# Patient Record
Sex: Female | Born: 1975 | Race: White | Hispanic: No | Marital: Single | Smoking: Never smoker
Health system: Southern US, Community
[De-identification: ages and names within clinical notes are randomized; demographics above are authoritative.]

## PROBLEM LIST (undated history)

## (undated) DIAGNOSIS — D649 Anemia, unspecified: Secondary | ICD-10-CM

## (undated) DIAGNOSIS — F419 Anxiety disorder, unspecified: Secondary | ICD-10-CM

## (undated) DIAGNOSIS — F32A Depression, unspecified: Secondary | ICD-10-CM

## (undated) DIAGNOSIS — Z789 Other specified health status: Secondary | ICD-10-CM

## (undated) DIAGNOSIS — R011 Cardiac murmur, unspecified: Secondary | ICD-10-CM

## (undated) HISTORY — PX: FRACTURE SURGERY: SHX138

## (undated) HISTORY — PX: APPENDECTOMY: SHX54

## (undated) HISTORY — DX: Anxiety disorder, unspecified: F41.9

## (undated) HISTORY — DX: Cardiac murmur, unspecified: R01.1

## (undated) HISTORY — DX: Depression, unspecified: F32.A

## (undated) HISTORY — DX: Anemia, unspecified: D64.9

---

## 2001-04-04 HISTORY — PX: GANGLION CYST EXCISION: SHX1691

## 2003-04-05 HISTORY — PX: WISDOM TOOTH EXTRACTION: SHX21

## 2008-04-04 HISTORY — PX: WRIST FRACTURE SURGERY: SHX121

## 2012-12-12 DIAGNOSIS — M7061 Trochanteric bursitis, right hip: Secondary | ICD-10-CM | POA: Insufficient documentation

## 2012-12-12 DIAGNOSIS — M25559 Pain in unspecified hip: Secondary | ICD-10-CM | POA: Insufficient documentation

## 2013-08-16 DIAGNOSIS — Z0000 Encounter for general adult medical examination without abnormal findings: Secondary | ICD-10-CM | POA: Insufficient documentation

## 2013-08-16 DIAGNOSIS — L709 Acne, unspecified: Secondary | ICD-10-CM | POA: Insufficient documentation

## 2013-08-16 DIAGNOSIS — F32A Depression, unspecified: Secondary | ICD-10-CM | POA: Insufficient documentation

## 2013-08-16 DIAGNOSIS — F419 Anxiety disorder, unspecified: Secondary | ICD-10-CM | POA: Insufficient documentation

## 2013-08-16 DIAGNOSIS — E663 Overweight: Secondary | ICD-10-CM | POA: Insufficient documentation

## 2016-06-13 ENCOUNTER — Ambulatory Visit: Payer: Self-pay | Admitting: Family Medicine

## 2018-04-02 DIAGNOSIS — K644 Residual hemorrhoidal skin tags: Secondary | ICD-10-CM | POA: Insufficient documentation

## 2018-04-04 DIAGNOSIS — D689 Coagulation defect, unspecified: Secondary | ICD-10-CM

## 2018-04-04 HISTORY — DX: Coagulation defect, unspecified: D68.9

## 2018-05-03 ENCOUNTER — Observation Stay (HOSPITAL_COMMUNITY): Payer: BLUE CROSS/BLUE SHIELD | Admitting: Anesthesiology

## 2018-05-03 ENCOUNTER — Observation Stay (HOSPITAL_COMMUNITY)
Admission: EM | Admit: 2018-05-03 | Discharge: 2018-05-04 | Disposition: A | Discharge status: Home / Self Care | Payer: BLUE CROSS/BLUE SHIELD | Attending: Surgery | Admitting: Surgery

## 2018-05-03 ENCOUNTER — Encounter (HOSPITAL_COMMUNITY)
Admission: EM | Disposition: A | Discharge status: Home / Self Care | Payer: Self-pay | Source: Home / Self Care | Attending: Emergency Medicine

## 2018-05-03 ENCOUNTER — Other Ambulatory Visit: Payer: Self-pay

## 2018-05-03 ENCOUNTER — Encounter (HOSPITAL_COMMUNITY): Payer: Self-pay | Admitting: Emergency Medicine

## 2018-05-03 ENCOUNTER — Emergency Department (HOSPITAL_COMMUNITY): Payer: BLUE CROSS/BLUE SHIELD

## 2018-05-03 DIAGNOSIS — Z975 Presence of (intrauterine) contraceptive device: Secondary | ICD-10-CM | POA: Diagnosis not present

## 2018-05-03 DIAGNOSIS — K3533 Acute appendicitis with perforation and localized peritonitis, with abscess: Secondary | ICD-10-CM | POA: Diagnosis not present

## 2018-05-03 DIAGNOSIS — R109 Unspecified abdominal pain: Secondary | ICD-10-CM | POA: Diagnosis present

## 2018-05-03 DIAGNOSIS — K3580 Unspecified acute appendicitis: Secondary | ICD-10-CM | POA: Diagnosis present

## 2018-05-03 HISTORY — DX: Other specified health status: Z78.9

## 2018-05-03 HISTORY — PX: LAPAROSCOPIC APPENDECTOMY: SHX408

## 2018-05-03 LAB — CBC
HCT: 42.1 % (ref 36.0–46.0)
Hemoglobin: 13.7 g/dL (ref 12.0–15.0)
MCH: 30 pg (ref 26.0–34.0)
MCHC: 32.5 g/dL (ref 30.0–36.0)
MCV: 92.1 fL (ref 80.0–100.0)
Platelets: 223 10*3/uL (ref 150–400)
RBC: 4.57 MIL/uL (ref 3.87–5.11)
RDW: 12.2 % (ref 11.5–15.5)
WBC: 19.1 10*3/uL — ABNORMAL HIGH (ref 4.0–10.5)
nRBC: 0 % (ref 0.0–0.2)

## 2018-05-03 LAB — COMPREHENSIVE METABOLIC PANEL
ALT: 17 U/L (ref 0–44)
AST: 16 U/L (ref 15–41)
Albumin: 4 g/dL (ref 3.5–5.0)
Alkaline Phosphatase: 58 U/L (ref 38–126)
Anion gap: 7 (ref 5–15)
BUN: 9 mg/dL (ref 6–20)
CO2: 16 mmol/L — ABNORMAL LOW (ref 22–32)
Calcium: 9 mg/dL (ref 8.9–10.3)
Chloride: 112 mmol/L — ABNORMAL HIGH (ref 98–111)
Creatinine, Ser: 0.83 mg/dL (ref 0.44–1.00)
GFR calc Af Amer: >60 mL/min (ref >60)
GFR calc non Af Amer: >60 mL/min (ref >60)
Glucose, Bld: 125 mg/dL — ABNORMAL HIGH (ref 70–99)
Potassium: 3.4 mmol/L — ABNORMAL LOW (ref 3.5–5.1)
Sodium: 135 mmol/L (ref 135–145)
Total Bilirubin: 0.4 mg/dL (ref 0.3–1.2)
Total Protein: 6.8 g/dL (ref 6.5–8.1)

## 2018-05-03 LAB — URINALYSIS, ROUTINE W REFLEX MICROSCOPIC
Bilirubin Urine: NEGATIVE
Glucose, UA: NEGATIVE mg/dL
Ketones, ur: NEGATIVE mg/dL
Leukocytes, UA: NEGATIVE
Nitrite: NEGATIVE
Protein, ur: NEGATIVE mg/dL
Specific Gravity, Urine: 1.041 — ABNORMAL HIGH (ref 1.005–1.030)
pH: 6 (ref 5.0–8.0)

## 2018-05-03 LAB — I-STAT BETA HCG BLOOD, ED (MC, WL, AP ONLY): I-stat hCG, quantitative: <5 m[IU]/mL (ref <5)

## 2018-05-03 LAB — LIPASE, BLOOD: Lipase: 34 U/L (ref 11–51)

## 2018-05-03 SURGERY — APPENDECTOMY, LAPAROSCOPIC
Anesthesia: General | Site: Abdomen

## 2018-05-03 MED ORDER — PROPOFOL 10 MG/ML IV BOLUS
INTRAVENOUS | Status: DC | PRN
  Administered 2018-05-03: 150 mg via INTRAVENOUS

## 2018-05-03 MED ORDER — MORPHINE SULFATE (PF) 4 MG/ML IV SOLN
4.0000 mg | Freq: Once | INTRAVENOUS | Status: AC | PRN
  Administered 2018-05-03: 4 mg via INTRAVENOUS
  Filled 2018-05-03: qty 1

## 2018-05-03 MED ORDER — BUPIVACAINE HCL (PF) 0.25 % IJ SOLN
INTRAMUSCULAR | Status: AC
  Filled 2018-05-03: qty 30

## 2018-05-03 MED ORDER — ONDANSETRON HCL 4 MG/2ML IJ SOLN
INTRAMUSCULAR | Status: AC
  Filled 2018-05-03: qty 2

## 2018-05-03 MED ORDER — LACTATED RINGERS IR SOLN
Status: DC | PRN
  Administered 2018-05-03: 1000 mL

## 2018-05-03 MED ORDER — SODIUM CHLORIDE (PF) 0.9 % IJ SOLN
INTRAMUSCULAR | Status: AC
  Administered 2018-05-03: 21:00:00
  Filled 2018-05-03: qty 50

## 2018-05-03 MED ORDER — 0.9 % SODIUM CHLORIDE (POUR BTL) OPTIME
TOPICAL | Status: DC | PRN
  Administered 2018-05-03: 1000 mL

## 2018-05-03 MED ORDER — ONDANSETRON HCL 4 MG/2ML IJ SOLN
4.0000 mg | Freq: Once | INTRAMUSCULAR | Status: AC | PRN
  Administered 2018-05-03: 4 mg via INTRAVENOUS
  Filled 2018-05-03: qty 2

## 2018-05-03 MED ORDER — MEPERIDINE HCL 50 MG/ML IJ SOLN
INTRAMUSCULAR | Status: AC
  Filled 2018-05-03: qty 1

## 2018-05-03 MED ORDER — PHENYLEPHRINE 40 MCG/ML (10ML) SYRINGE FOR IV PUSH (FOR BLOOD PRESSURE SUPPORT)
PREFILLED_SYRINGE | INTRAVENOUS | Status: AC
  Filled 2018-05-03: qty 10

## 2018-05-03 MED ORDER — METRONIDAZOLE IN NACL 5-0.79 MG/ML-% IV SOLN
500.0000 mg | Freq: Once | INTRAVENOUS | Status: AC
  Administered 2018-05-03: 500 mg via INTRAVENOUS
  Filled 2018-05-03: qty 100

## 2018-05-03 MED ORDER — ONDANSETRON HCL 4 MG/2ML IJ SOLN
INTRAMUSCULAR | Status: DC | PRN
  Administered 2018-05-03: 4 mg via INTRAVENOUS

## 2018-05-03 MED ORDER — IOPAMIDOL (ISOVUE-300) INJECTION 61%
100.0000 mL | Freq: Once | INTRAVENOUS | Status: AC | PRN
  Administered 2018-05-03: 100 mL via INTRAVENOUS

## 2018-05-03 MED ORDER — NEOSTIGMINE METHYLSULFATE 3 MG/3ML IV SOSY
PREFILLED_SYRINGE | INTRAVENOUS | Status: AC
  Filled 2018-05-03: qty 3

## 2018-05-03 MED ORDER — GLYCOPYRROLATE PF 0.2 MG/ML IJ SOSY
PREFILLED_SYRINGE | INTRAMUSCULAR | Status: AC
  Filled 2018-05-03: qty 3

## 2018-05-03 MED ORDER — GLYCOPYRROLATE PF 0.2 MG/ML IJ SOSY
PREFILLED_SYRINGE | INTRAMUSCULAR | Status: DC | PRN
  Administered 2018-05-03: 0.4 mg via INTRAVENOUS

## 2018-05-03 MED ORDER — SODIUM CHLORIDE 0.9 % IV SOLN
2.0000 g | Freq: Once | INTRAVENOUS | Status: AC
  Administered 2018-05-03: 2 g via INTRAVENOUS
  Filled 2018-05-03: qty 20

## 2018-05-03 MED ORDER — BUPIVACAINE HCL (PF) 0.25 % IJ SOLN
INTRAMUSCULAR | Status: DC | PRN
  Administered 2018-05-03: 15 mL

## 2018-05-03 MED ORDER — DEXAMETHASONE SODIUM PHOSPHATE 10 MG/ML IJ SOLN
INTRAMUSCULAR | Status: DC | PRN
  Administered 2018-05-03: 10 mg via INTRAVENOUS

## 2018-05-03 MED ORDER — IOPAMIDOL (ISOVUE-300) INJECTION 61%
INTRAVENOUS | Status: AC
  Filled 2018-05-03: qty 100

## 2018-05-03 MED ORDER — PHENYLEPHRINE 40 MCG/ML (10ML) SYRINGE FOR IV PUSH (FOR BLOOD PRESSURE SUPPORT)
PREFILLED_SYRINGE | INTRAVENOUS | Status: DC | PRN
  Administered 2018-05-03 (×4): 80 ug via INTRAVENOUS

## 2018-05-03 MED ORDER — MIDAZOLAM HCL 2 MG/2ML IJ SOLN
INTRAMUSCULAR | Status: AC
  Filled 2018-05-03: qty 2

## 2018-05-03 MED ORDER — FENTANYL CITRATE (PF) 250 MCG/5ML IJ SOLN
INTRAMUSCULAR | Status: AC
  Filled 2018-05-03: qty 5

## 2018-05-03 MED ORDER — NEOSTIGMINE METHYLSULFATE 3 MG/3ML IV SOSY
PREFILLED_SYRINGE | INTRAVENOUS | Status: DC | PRN
  Administered 2018-05-03: 3 mg via INTRAVENOUS

## 2018-05-03 MED ORDER — MIDAZOLAM HCL 5 MG/5ML IJ SOLN
INTRAMUSCULAR | Status: DC | PRN
  Administered 2018-05-03: 2 mg via INTRAVENOUS

## 2018-05-03 MED ORDER — SUCCINYLCHOLINE CHLORIDE 200 MG/10ML IV SOSY
PREFILLED_SYRINGE | INTRAVENOUS | Status: DC | PRN
  Administered 2018-05-03: 120 mg via INTRAVENOUS

## 2018-05-03 MED ORDER — LACTATED RINGERS IV SOLN
INTRAVENOUS | Status: DC | PRN
  Administered 2018-05-03 (×2): via INTRAVENOUS

## 2018-05-03 MED ORDER — DEXAMETHASONE SODIUM PHOSPHATE 10 MG/ML IJ SOLN
INTRAMUSCULAR | Status: AC
  Filled 2018-05-03: qty 1

## 2018-05-03 MED ORDER — ROCURONIUM BROMIDE 50 MG/5ML IV SOSY
PREFILLED_SYRINGE | INTRAVENOUS | Status: DC | PRN
  Administered 2018-05-03: 30 mg via INTRAVENOUS

## 2018-05-03 MED ORDER — FENTANYL CITRATE (PF) 100 MCG/2ML IJ SOLN
INTRAMUSCULAR | Status: DC | PRN
  Administered 2018-05-03: 50 ug via INTRAVENOUS
  Administered 2018-05-03: 100 ug via INTRAVENOUS

## 2018-05-03 MED ORDER — PROPOFOL 10 MG/ML IV BOLUS
INTRAVENOUS | Status: AC
  Filled 2018-05-03: qty 20

## 2018-05-03 MED ORDER — HYDROMORPHONE HCL 1 MG/ML IJ SOLN: 0.2500 mg | INTRAMUSCULAR | Status: DC | PRN

## 2018-05-03 MED ORDER — LIDOCAINE 2% (20 MG/ML) 5 ML SYRINGE
INTRAMUSCULAR | Status: DC | PRN
  Administered 2018-05-03: 80 mg via INTRAVENOUS

## 2018-05-03 MED ORDER — MEPERIDINE HCL 50 MG/ML IJ SOLN: 6.2500 mg | INTRAMUSCULAR | Status: DC | PRN

## 2018-05-03 MED ORDER — SODIUM CHLORIDE 0.9 % IV BOLUS
1000.0000 mL | Freq: Once | INTRAVENOUS | Status: AC
  Administered 2018-05-03: 1000 mL via INTRAVENOUS

## 2018-05-03 MED ORDER — PROMETHAZINE HCL 25 MG/ML IJ SOLN: 6.2500 mg | INTRAMUSCULAR | Status: DC | PRN

## 2018-05-03 SURGICAL SUPPLY — 33 items
APPLIER CLIP ROT 10 11.4 M/L (STAPLE)
CHLORAPREP W/TINT 26ML (MISCELLANEOUS) ×2 IMPLANT
CLIP APPLIE ROT 10 11.4 M/L (STAPLE) IMPLANT
COVER SURGICAL LIGHT HANDLE (MISCELLANEOUS) ×2 IMPLANT
COVER WAND RF STERILE (DRAPES) IMPLANT
CUTTER FLEX LINEAR 45M (STAPLE) IMPLANT
DECANTER SPIKE VIAL GLASS SM (MISCELLANEOUS) ×2 IMPLANT
DERMABOND ADVANCED (GAUZE/BANDAGES/DRESSINGS) ×1
DERMABOND ADVANCED .7 DNX12 (GAUZE/BANDAGES/DRESSINGS) ×1 IMPLANT
DRAPE LAPAROSCOPIC ABDOMINAL (DRAPES) ×2 IMPLANT
ELECT PENCIL ROCKER SW 15FT (MISCELLANEOUS) ×2 IMPLANT
ELECT REM PT RETURN 15FT ADLT (MISCELLANEOUS) ×2 IMPLANT
ENDOLOOP SUT PDS II  0 18 (SUTURE)
ENDOLOOP SUT PDS II 0 18 (SUTURE) IMPLANT
GLOVE BIOGEL M 7.0 STRL (GLOVE) ×2 IMPLANT
GLOVE BIOGEL PI IND STRL 7.5 (GLOVE) ×2 IMPLANT
GLOVE BIOGEL PI INDICATOR 7.5 (GLOVE) ×2
GLOVE SURG ORTHO 8.0 STRL STRW (GLOVE) ×2 IMPLANT
GOWN STRL REUS W/TWL XL LVL3 (GOWN DISPOSABLE) ×6 IMPLANT
KIT BASIN OR (CUSTOM PROCEDURE TRAY) ×2 IMPLANT
POUCH SPECIMEN RETRIEVAL 10MM (ENDOMECHANICALS) ×2 IMPLANT
RELOAD 45 VASCULAR/THIN (ENDOMECHANICALS) IMPLANT
RELOAD STAPLE TA45 3.5 REG BLU (ENDOMECHANICALS) ×4 IMPLANT
SET IRRIG TUBING LAPAROSCOPIC (IRRIGATION / IRRIGATOR) ×2 IMPLANT
SHEARS HARMONIC ACE PLUS 36CM (ENDOMECHANICALS) ×2 IMPLANT
STRIP CLOSURE SKIN 1/2X4 (GAUZE/BANDAGES/DRESSINGS) ×2 IMPLANT
SUT MNCRL AB 4-0 PS2 18 (SUTURE) ×2 IMPLANT
TOWEL OR 17X26 10 PK STRL BLUE (TOWEL DISPOSABLE) ×2 IMPLANT
TOWEL OR NON WOVEN STRL DISP B (DISPOSABLE) ×2 IMPLANT
TRAY FOLEY MTR SLVR 14FR STAT (SET/KITS/TRAYS/PACK) ×2 IMPLANT
TRAY LAPAROSCOPIC (CUSTOM PROCEDURE TRAY) ×2 IMPLANT
TROCAR XCEL BLUNT TIP 100MML (ENDOMECHANICALS) ×2 IMPLANT
TROCAR XCEL NON-BLD 11X100MML (ENDOMECHANICALS) ×2 IMPLANT

## 2018-05-03 NOTE — Anesthesia Preprocedure Evaluation (Signed)
Anesthesia Evaluation  Patient identified by MRN, date of birth, ID band Patient awake    Reviewed: Allergy & Precautions, NPO status , Patient's Chart, lab work & pertinent test results  Airway Mallampati: II  TM Distance: >3 FB Neck ROM: Full    Dental  (+) Dental Advisory Given   Pulmonary neg pulmonary ROS,    Pulmonary exam normal breath sounds clear to auscultation       Cardiovascular negative cardio ROS Normal cardiovascular exam Rhythm:Regular Rate:Normal     Neuro/Psych negative neurological ROS  negative psych ROS   GI/Hepatic negative GI ROS, Neg liver ROS,   Endo/Other  negative endocrine ROS  Renal/GU negative Renal ROS     Musculoskeletal negative musculoskeletal ROS (+)   Abdominal   Peds  Hematology negative hematology ROS (+)   Anesthesia Other Findings   Reproductive/Obstetrics negative OB ROS                             Anesthesia Physical Anesthesia Plan  ASA: II and emergent  Anesthesia Plan: General   Post-op Pain Management:    Induction: Intravenous, Rapid sequence and Cricoid pressure planned  PONV Risk Score and Plan: 4 or greater and Ondansetron, Dexamethasone, Midazolam and Treatment may vary due to age or medical condition  Airway Management Planned: Oral ETT  Additional Equipment: None  Intra-op Plan:   Post-operative Plan: Extubation in OR  Informed Consent: I have reviewed the patients History and Physical, chart, labs and discussed the procedure including the risks, benefits and alternatives for the proposed anesthesia with the patient or authorized representative who has indicated his/her understanding and acceptance.     Dental advisory given  Plan Discussed with: CRNA  Anesthesia Plan Comments:         Anesthesia Quick Evaluation

## 2018-05-03 NOTE — ED Notes (Addendum)
Report given to OR. OR will be coming to transport.  Pt has had nothing PO since 1500

## 2018-05-03 NOTE — Op Note (Signed)
OPERATIVE REPORT - LAPAROSCOPIC APPENDECTOMY  Preop diagnosis:  Acute appendicitis  Postop diagnosis:  same  Procedure:  Laparoscopic appendectomy  Surgeon:  Darnell Level, MD  Anesthesia:  general endotracheal  Estimated blood loss:  minimal  Preparation:  Chlora-prep  Complications:  none  Indications:  Patient is a 43 year old female who presents to the emergency department on referral from her primary care physician's office with less than 24-hour history of abdominal pain localizing to the right lower quadrant associated with nausea.  Evaluation included laboratory studies showing an elevated white blood cell count of 19.1.  CT scan of the abdomen and pelvis demonstrates findings consistent with acute appendicitis.  Procedure:  Patient is brought to the operating room and placed in a supine position on the operating room table. Following administration of general anesthesia, a time out was held and the patient's name and procedure is confirmed. Patient is then prepped and draped in the usual strict aseptic fashion.  After ascertaining that an adequate level of anesthesia has been achieved, a peri-umbilical incision is made with a #15 blade. Dissection is carried down to the fascia. Fascia is incised in the midline and the peritoneal cavity is entered cautiously. A #0-vicryl pursestring suture is placed in the fascia. An Hassan cannula is introduced under direct vision and secured with the pursestring suture. The abdomen is insufflated with carbon dioxide. The laparoscope is introduced and the abdomen is explored. Operative ports are placed in the right upper quadrant and left lower quadrant. The appendix is identified. The mesoappendix is divided with the harmonic scalpel. Dissection is carried down to the base of the appendix. The base of the appendix is dissected out clearing the junction with the cecal wall. Using an Endo-GIA stapler, the base of the appendix is transected at the  junction with the cecal wall. There is good approximation of tissue along the staple line. There is good hemostasis along the staple line. The appendix is placed into an endo-catch bag and withdrawn through the umbilical port. The #0-vicryl pursestring suture is tied securely.  Right lower quadrant is irrigated with warm saline which is evacuated. Good hemostasis is noted. Ports are removed under direct vision. Good hemostasis is noted at the port sites. Pneumoperitoneum is released.  Skin incisions are anesthetized with local anesthetic. Wounds are closed with interrupted 4-0 Monocryl subcuticular sutures. Wounds are washed and dried and Dermabond was applied. The patient is awakened from anesthesia and brought to the recovery room. The patient tolerated the procedure well.  Darnell Level, MD [facility] Office: [phone]

## 2018-05-03 NOTE — ED Provider Notes (Signed)
[facility]-EMERGENCY DEPT Provider Note   CSN: [id number] Arrival date & time: 05/03/18  1645     History   Chief Complaint Chief Complaint  Patient presents with  . Abdominal Pain    HPI Nancy Patel is a 43 y.o. female.  The history is provided by the patient and medical records. No language interpreter was used.  Abdominal Pain  Associated symptoms: diarrhea and nausea   Associated symptoms: no vomiting    Nancy NoonKaren Patel is a 43 y.o. female  with no pertinent past medical history who presents to the Emergency Department complaining of lower abdominal pain which began approximately 1 AM this morning.  Associated with nausea and 2 loose stools.  She was seen by her primary care doctor today who referred her to the emergency department for concerns of appendicitis.  She was given a dose of 30 mg IM Toradol and reports significant improvement in her pain.  Feels as if her nausea has improved as well although was not given any antiemetic.  She has not thrown up.  Denies any fever or chills.  Her abdominal pain started more diffuse across the entire lower abdomen and around her bellybutton and as the day has gone on, has migrated towards the right lower quadrant.  She denies any back pain.  No urinary symptoms.  Denies history of similar.  No prior abdominal surgeries.  Past Medical History:  Diagnosis Date  . Medical history non-contributory     Patient Active Problem List   Diagnosis Date Noted  . Appendicitis, acute 05/03/2018  . Acute appendicitis 05/03/2018    Past Surgical History:  Procedure Laterality Date  . FRACTURE SURGERY    . GANGLION CYST EXCISION    . GANGLION CYST EXCISION Left 2003  . WRIST FRACTURE SURGERY Right 2010     OB History   No obstetric history on file.      Home Medications    Prior to Admission medications   Medication Sig Start Date End Date Taking? Authorizing Provider  busPIRone (BUSPAR) 30 MG tablet Take 15 mg  by mouth 2 (two) times daily.  04/15/18  Yes [provider]  FORFIVO XL 450 MG TB24 Take 1 tablet by mouth daily.  04/24/18  Yes [provider]  levonorgestrel (MIRENA) 20 MCG/24HR IUD 1 each by Intrauterine route once.   Yes [provider]  mirtazapine (REMERON) 45 MG tablet Take 45 mg by mouth at bedtime.  04/23/18  Yes [provider]  pramipexole (MIRAPEX) 0.25 MG tablet Take 0.25 mg by mouth at bedtime.  01/16/18  Yes [provider]  topiramate (TOPAMAX) 100 MG tablet Take 100 mg by mouth 2 (two) times daily.  02/18/18  Yes [provider]  triamcinolone cream (KENALOG) 0.1 % Apply 1 application topically at bedtime.   Yes [provider]    Family History History reviewed. No pertinent family history.  Social History Social History   Tobacco Use  . Smoking status: Never Smoker  . Smokeless tobacco: Never Used  Substance Use Topics  . Alcohol use: Yes    Alcohol/week: 2.0 standard drinks    Types: 2 Glasses of wine per week    Comment: per week  . Drug use: Never     Allergies   Patient has no known allergies.   Review of Systems Review of Systems  Gastrointestinal: Positive for abdominal pain, diarrhea and nausea. Negative for vomiting.  All other systems reviewed and are negative.  Physical Exam Updated Vital Signs BP 125/77   Pulse (!) 109   Temp 98.5 F (36.9 C) (Oral)   Resp 18   Ht 5\' 8"  (1.727 m)   Wt 90.3 kg   SpO2 100%   BMI 30.26 kg/m   Physical Exam Vitals signs and nursing note reviewed.  Constitutional:      General: She is not in acute distress.    Appearance: She is well-developed.  HENT:     Head: Normocephalic and atraumatic.  Neck:     Musculoskeletal: Neck supple.  Cardiovascular:     Heart sounds: Normal heart sounds. No murmur.     Comments: Regular rate and rhythm on exam. Pulmonary:     Effort: Pulmonary effort is normal. No respiratory distress.     Breath  sounds: Normal breath sounds.  Abdominal:     General: There is no distension.     Palpations: Abdomen is soft.     Comments: Tenderness across the lower abdomen, most significantly to the right lower quadrant.  Skin:    General: Skin is warm and dry.  Neurological:     Mental Status: She is alert and oriented to person, place, and time.      ED Treatments / Results  Labs (all labs ordered are listed, but only abnormal results are displayed) Labs Reviewed  COMPREHENSIVE METABOLIC PANEL - Abnormal; Notable for the following components:      Result Value   Potassium 3.4 (*)    Chloride 112 (*)    CO2 16 (*)    Glucose, Bld 125 (*)    All other components within normal limits  CBC - Abnormal; Notable for the following components:   WBC 19.1 (*)    All other components within normal limits  URINALYSIS, ROUTINE W REFLEX MICROSCOPIC - Abnormal; Notable for the following components:   Specific Gravity, Urine 1.041 (*)    Hgb urine dipstick SMALL (*)    Bacteria, UA RARE (*)    All other components within normal limits  LIPASE, BLOOD  HIV ANTIBODY (ROUTINE TESTING W REFLEX)  I-STAT BETA HCG BLOOD, ED ([facility], [facility], [facility] ONLY)    EKG None  Radiology Ct Abdomen Pelvis W Contrast  Result Date: 05/03/2018 CLINICAL DATA:  Abdominal pain starting at 1 a.m. today. Nausea and diarrhea. Evaluate for appendicitis. EXAM: CT ABDOMEN AND PELVIS WITH CONTRAST TECHNIQUE: Multidetector CT imaging of the abdomen and pelvis was performed using the standard protocol following bolus administration of intravenous contrast. CONTRAST:  100mL ISOVUE-300 IOPAMIDOL (ISOVUE-300) INJECTION 61% COMPARISON:  None. FINDINGS: Lower chest: No acute abnormality. Hepatobiliary: There is a 11 mm cyst in the hepatic dome on series 2, image 9. There is ill-defined low-attenuation in the posterior dome on series 2, image 12 measuring up to 8 mm. There is slight increased attenuation in this region on the delayed image. No other  liver lesions. The gallbladder is normal. The portal vein is normal. Pancreas: Unremarkable. No pancreatic ductal dilatation or surrounding inflammatory changes. Spleen: Normal in size without focal abnormality. Adrenals/Urinary Tract: Adrenal glands are unremarkable. Kidneys are normal, without renal calculi, focal lesion, or hydronephrosis. Bladder is unremarkable. Stomach/Bowel: The stomach is normal. The colon is normal. The appendix is abnormal with increased caliber measuring up to 11 mm, wall thickening, and adjacent fat stranding. No appendicoliths. The appendix is fluid-filled. There may be mild secondary inflammation of the distal ileum. The small bowel is otherwise normal. Vascular/Lymphatic: No significant vascular findings are present. No enlarged  abdominal or pelvic lymph nodes. Reproductive: There is an IUD in the uterus. The right ovary is normal. There is a dominant follicle in the left ovary measuring up to 4.2 cm. Other: There is fluid in the pelvis which could be reactive fluid from the appendicitis or physiologic fluid given patient age. Musculoskeletal: No acute or significant osseous findings. IMPRESSION: 1. Appendicitis. Appendix: Location: Right side of the pelvis. Diameter: 11 mm Appendicolith: No Mucosal hyper-enhancement: No Extraluminal gas: No Periappendiceal collection: No 2. 8 mm nonspecific low-attenuation lesion in the hepatic dome may represent a small hemangioma but can not be characterized on this study. Recommend an ultrasound for further evaluation when the patient is able. 3. Dominant follicle measuring 4.2 cm in the left ovary. Electronically Signed   By: Gerome Sam III M.D   On: 05/03/2018 20:21    Procedures Procedures (including critical care time)  Medications Ordered in ED Medications  ondansetron [facility]) injection 4 mg (4 mg Intravenous Given 05/03/18 1931)  morphine 4 MG/ML injection 4 mg (4 mg Intravenous Given 05/03/18 1933)  sodium chloride 0.9 % bolus  1,000 mL (0 mLs Intravenous Stopped 05/03/18 2038)  iopamidol (ISOVUE-300) 61 % injection 100 mL (100 mLs Intravenous Contrast Given 05/03/18 1948)  sodium chloride (PF) 0.9 % injection (  Given by Other 05/03/18 2038)  cefTRIAXone (ROCEPHIN) 2 g in sodium chloride 0.9 % 100 mL IVPB ( Intravenous Rate/Dose Verify 05/03/18 2131)    And  metroNIDAZOLE (FLAGYL) IVPB 500 mg (500 mg Intravenous New Bag/Given 05/03/18 2142)     Initial Impression / Assessment and Plan / ED Course  I have reviewed the triage vital signs and the nursing notes.  Pertinent labs & imaging results that were available during my care of the patient were reviewed by me and considered in my medical decision making (see chart for details).    Nancy Patel is a 43 y.o. female who presents to ED for central abdominal discomfort which began at 1 AM today which is now migrated to her right lower quadrant.  Seen by her primary care doctor who sent her to ER for concerns of possible appendicitis.  Does have significant leukocytosis of 19 and tender at McBurney's point.  CT was obtained showing findings consistent with appendicitis.  Discussed with general surgery who will admit for ongoing care.  Final Clinical Impressions(s) / ED Diagnoses   Final diagnoses:  Acute appendicitis, unspecified acute appendicitis type    ED Discharge Orders    None       Ward, Chase Picket, PA-C 05/03/18 2259    Tilden Fossa, MD 05/07/18 [phone]

## 2018-05-03 NOTE — Transfer of Care (Signed)
Immediate Anesthesia Transfer of Care Note  Patient: Nancy NoonKaren Patel  Procedure(s) Performed: APPENDECTOMY LAPAROSCOPIC (N/A Abdomen)  Patient Location: PACU  Anesthesia Type:General  Level of Consciousness: drowsy and patient cooperative  Airway & Oxygen Therapy: Patient Spontanous Breathing and Patient connected to face mask oxygen  Post-op Assessment: Report given to RN and Post -op Vital signs reviewed and stable  Post vital signs: Reviewed and stable  Last Vitals:  Vitals Value Taken Time  BP    Temp    Pulse 118 05/03/2018 11:36 PM  Resp 20 05/03/2018 11:36 PM  SpO2 100 % 05/03/2018 11:36 PM  Vitals shown include unvalidated device data.  Last Pain:  Vitals:   05/03/18 2034  TempSrc:   PainSc: 2          Complications: No apparent anesthesia complications

## 2018-05-03 NOTE — Anesthesia Procedure Notes (Signed)
Procedure Name: Intubation Date/Time: 05/03/2018 10:34 PM Performed by: Montel Clock, CRNA Pre-anesthesia Checklist: Patient identified, Emergency Drugs available, Suction available, Patient being monitored and Timeout performed Patient Re-evaluated:Patient Re-evaluated prior to induction Oxygen Delivery Method: Circle system utilized Preoxygenation: Pre-oxygenation with 100% oxygen Induction Type: IV induction, Rapid sequence and Cricoid Pressure applied Laryngoscope Size: Mac and 3 Grade View: Grade I Tube type: Oral Tube size: 7.5 mm Number of attempts: 1 Airway Equipment and Method: Stylet Placement Confirmation: ETT inserted through vocal cords under direct vision,  positive ETCO2 and breath sounds checked- equal and bilateral Secured at: 21 cm Tube secured with: Tape Dental Injury: Teeth and Oropharynx as per pre-operative assessment

## 2018-05-03 NOTE — ED Notes (Signed)
Pt valuables locked with security,   Other patient belongs are located in locker number [phone] in the Emergency Dept

## 2018-05-03 NOTE — ED Triage Notes (Signed)
Pt c/o abd pains that started at 1am today with some nausea and 2 episodes of diarrhea. Saw PCP who referred to ED to rule out appendicitis. Toradol 30mg  IM given at PCP. Denies urinary problems.

## 2018-05-03 NOTE — Anesthesia Postprocedure Evaluation (Signed)
Anesthesia Post Note  Patient: Nancy Patel  Procedure(s) Performed: APPENDECTOMY LAPAROSCOPIC (N/A Abdomen)     Patient location during evaluation: PACU Anesthesia Type: General Level of consciousness: sedated and patient cooperative Pain management: pain level controlled Vital Signs Assessment: post-procedure vital signs reviewed and stable Respiratory status: spontaneous breathing Cardiovascular status: stable Anesthetic complications: no    Last Vitals:  Vitals:   05/03/18 2305 05/03/18 2337  BP:  (P) 140/74  Pulse:    Resp:    Temp:  (P) 36.9 C  SpO2: 100%     Last Pain:  Vitals:   05/03/18 2337  TempSrc:   PainSc: (P) 0-No pain                 Lewie Loron

## 2018-05-03 NOTE — H&P (Signed)
Nancy Patel is an 43 y.o. female.    General Surgery [facility]  Chief Complaint: acute appendicitis  HPI: Patient is a 43 year old female who presents to the emergency department on referral from her primary care physician's office with less than 24-hour history of abdominal pain localizing to the right lower quadrant associated with nausea.  Evaluation included laboratory studies showing an elevated white blood cell count of 19.1.  CT scan of the abdomen and pelvis demonstrates findings consistent with acute appendicitis.  Patient has had no prior abdominal surgery.  Patient denies any medication allergies.  She denies any other significant medical problems other than depression.  Past Medical History:  Diagnosis Date  . Medical history non-contributory     Past Surgical History:  Procedure Laterality Date  . FRACTURE SURGERY    . GANGLION CYST EXCISION    . GANGLION CYST EXCISION Left 2003  . WRIST FRACTURE SURGERY Right 2010    History reviewed. No pertinent family history. Social History:  reports that she has never smoked. She has never used smokeless tobacco. She reports current alcohol use of about 2.0 standard drinks of alcohol per week. She reports that she does not use drugs.  Allergies: No Known Allergies  (Not in a hospital admission)   Results for orders placed or performed during the hospital encounter of 05/03/18 (from the past 48 hour(s))  Lipase, blood     Status: None   Collection Time: 05/03/18  5:13 PM  Result Value Ref Range   Lipase 34 11 - 51 U/L    Comment: Performed at [facility], [address] [place], [place] [address]  Comprehensive metabolic panel     Status: Abnormal   Collection Time: 05/03/18  5:13 PM  Result Value Ref Range   Sodium 135 135 - 145 mmol/L   Potassium 3.4 (L) 3.5 - 5.1 mmol/L   Chloride 112 (H) 98 - 111 mmol/L   CO2 16 (L) 22 - 32 mmol/L   Glucose, Bld 125 (H) 70 - 99 mg/dL   BUN 9 6 - 20 mg/dL   Creatinine, Ser 6.38 0.44 - 1.00 mg/dL   Calcium 9.0 8.9 - 93.7 mg/dL   Total Protein 6.8 6.5 - 8.1 g/dL   Albumin 4.0 3.5 - 5.0 g/dL   AST 16 15 - 41 U/L   ALT 17 0 - 44 U/L   Alkaline Phosphatase 58 38 - 126 U/L   Total Bilirubin 0.4 0.3 - 1.2 mg/dL   GFR calc non Af Amer >60 >60 mL/min   GFR calc Af Amer >60 >60 mL/min   Anion gap 7 5 - 15    Comment: Performed at [facility], [address], [place], [place] [address]  CBC     Status: Abnormal   Collection Time: 05/03/18  5:13 PM  Result Value Ref Range   WBC 19.1 (H) 4.0 - 10.5 K/uL   RBC 4.57 3.87 - 5.11 MIL/uL   Hemoglobin 13.7 12.0 - 15.0 g/dL   HCT 68.1 15.7 - 26.2 %   MCV 92.1 80.0 - 100.0 fL   MCH 30.0 26.0 - 34.0 pg   MCHC 32.5 30.0 - 36.0 g/dL   RDW 03.5 59.7 - 41.6 %   Platelets 223 150 - 400 K/uL   nRBC 0.0 0.0 - 0.2 %    Comment: Performed at [facility], [address] [place], [place] [address]  I-Stat beta hCG blood, ED  Status: None   Collection Time: 05/03/18  5:16 PM  Result Value Ref Range   I-stat hCG, quantitative <5.0 <5 mIU/mL   Comment 3            Comment:   GEST. AGE      CONC.  (mIU/mL)   <=1 WEEK        5 - 50     2 WEEKS       50 - 500     3 WEEKS       100 - 10,000     4 WEEKS     1,000 - 30,000        FEMALE AND NON-PREGNANT FEMALE:     LESS THAN 5 mIU/mL   Urinalysis, Routine w reflex microscopic     Status: Abnormal   Collection Time: 05/03/18  9:07 PM  Result Value Ref Range   Color, Urine YELLOW YELLOW   APPearance CLEAR CLEAR   Specific Gravity, Urine 1.041 (H) 1.005 - 1.030   pH 6.0 5.0 - 8.0   Glucose, UA NEGATIVE NEGATIVE mg/dL   Hgb urine dipstick SMALL (A) NEGATIVE   Bilirubin Urine NEGATIVE NEGATIVE   Ketones, ur NEGATIVE NEGATIVE mg/dL   Protein, ur NEGATIVE NEGATIVE mg/dL   Nitrite NEGATIVE NEGATIVE   Leukocytes, UA NEGATIVE NEGATIVE   RBC / HPF 0-5 0 - 5 RBC/hpf   WBC, UA 0-5 0 - 5 WBC/hpf    Bacteria, UA RARE (A) NONE SEEN   Squamous Epithelial / LPF 0-5 0 - 5   Mucus PRESENT     Comment: Performed at [facility], [address] [place], [place] [id number]   Ct Abdomen Pelvis W Contrast  Result Date: 05/03/2018 CLINICAL DATA:  Abdominal pain starting at 1 a.m. today. Nausea and diarrhea. Evaluate for appendicitis. EXAM: CT ABDOMEN AND PELVIS WITH CONTRAST TECHNIQUE: Multidetector CT imaging of the abdomen and pelvis was performed using the standard protocol following bolus administration of intravenous contrast. CONTRAST:  100mL ISOVUE-300 IOPAMIDOL (ISOVUE-300) INJECTION 61% COMPARISON:  None. FINDINGS: Lower chest: No acute abnormality. Hepatobiliary: There is a 11 mm cyst in the hepatic dome on series 2, image 9. There is ill-defined low-attenuation in the posterior dome on series 2, image 12 measuring up to 8 mm. There is slight increased attenuation in this region on the delayed image. No other liver lesions. The gallbladder is normal. The portal vein is normal. Pancreas: Unremarkable. No pancreatic ductal dilatation or surrounding inflammatory changes. Spleen: Normal in size without focal abnormality. Adrenals/Urinary Tract: Adrenal glands are unremarkable. Kidneys are normal, without renal calculi, focal lesion, or hydronephrosis. Bladder is unremarkable. Stomach/Bowel: The stomach is normal. The colon is normal. The appendix is abnormal with increased caliber measuring up to 11 mm, wall thickening, and adjacent fat stranding. No appendicoliths. The appendix is fluid-filled. There may be mild secondary inflammation of the distal ileum. The small bowel is otherwise normal. Vascular/Lymphatic: No significant vascular findings are present. No enlarged abdominal or pelvic lymph nodes. Reproductive: There is an IUD in the uterus. The right ovary is normal. There is a dominant follicle in the left ovary measuring up to 4.2 cm. Other: There is fluid in the pelvis which could  be reactive fluid from the appendicitis or physiologic fluid given patient age. Musculoskeletal: No acute or significant osseous findings. IMPRESSION: 1. Appendicitis. Appendix: Location: Right side of the pelvis. Diameter: 11 mm Appendicolith: No Mucosal hyper-enhancement: No Extraluminal gas: No Periappendiceal collection: No 2. 8 mm  nonspecific low-attenuation lesion in the hepatic dome may represent a small hemangioma but can not be characterized on this study. Recommend an ultrasound for further evaluation when the patient is able. 3. Dominant follicle measuring 4.2 cm in the left ovary. Electronically Signed   By: Gerome Sam III M.D   On: 05/03/2018 20:21    Review of Systems  Constitutional: Positive for chills. Negative for diaphoresis and fever.  HENT: Negative.   Eyes: Negative.   Respiratory: Negative.   Cardiovascular: Negative.   Gastrointestinal: Positive for abdominal pain and nausea. Negative for vomiting.  Genitourinary: Negative.   Musculoskeletal: Negative.   Skin: Negative.   Neurological: Negative.   Endo/Heme/Allergies: Negative.   Psychiatric/Behavioral: Positive for depression.    Blood pressure (!) 123/98, pulse (!) 105, temperature 98.5 F (36.9 C), temperature source Oral, resp. rate 18, height 5\' 8"  (1.727 m), weight 90.3 kg, SpO2 100 %. Physical Exam  Constitutional: She is oriented to person, place, and time. She appears well-developed and well-nourished. No distress.  HENT:  Head: Normocephalic and atraumatic.  Mouth/Throat: Oropharynx is clear and moist. No oropharyngeal exudate.  Eyes: Pupils are equal, round, and reactive to light. Conjunctivae are normal. No scleral icterus.  Neck: Normal range of motion. Neck supple. No tracheal deviation present. No thyromegaly present.  Cardiovascular: Normal rate, regular rhythm and normal heart sounds.  No murmur heard. Respiratory: Effort normal and breath sounds normal. No respiratory distress. She has no  wheezes.  GI: Soft. She exhibits no distension and no mass. There is abdominal tenderness. There is guarding. There is no rebound.  quiet  Musculoskeletal: Normal range of motion.        General: No edema.  Neurological: She is alert and oriented to person, place, and time.  Skin: Skin is warm and dry. She is not diaphoretic.  Psychiatric: She has a normal mood and affect. Her behavior is normal.     Assessment/Plan Acute appendicitis  The patient wished to discuss the possibility of nonoperative management of acute appendicitis.  We discussed the limited studies that were available for this treatment course.  We discussed European studies.  We discussed her CT scan findings and her laboratory studies.  We discussed the proposed traditional course of laparoscopic appendectomy with intravenous antibiotics.  We discussed an overnight hospital stay and possible discharge home tomorrow depending on operative findings and how she did clinically following the surgery.  After this discussion, the patient wishes to proceed with laparoscopic appendectomy this evening.  We discussed the procedure.  We discussed the location of the surgical incisions.  We discussed the possibility of conversion to open surgery.  We discussed the possible need for prolonged antibiotic therapy.  Patient understands and wishes to proceed with surgery this evening.  I have notified the operating room.  Intravenous antibiotics have been started in the emergency department.  We will plan to proceed this evening with appendectomy.  The risks and benefits of the procedure have been discussed at length with the patient.  The patient understands the proposed procedure, potential alternative treatments, and the course of recovery to be expected.  All of the patient's questions have been answered at this time.  The patient wishes to proceed with surgery.  Darnell Level, MD [facility] Office: [phone]    Darnell Level, MD 05/03/2018, 9:39 PM

## 2018-05-04 ENCOUNTER — Encounter (HOSPITAL_COMMUNITY): Payer: Self-pay | Admitting: Surgery

## 2018-05-04 MED ORDER — ONDANSETRON HCL 4 MG/2ML IJ SOLN: 4.0000 mg | Freq: Four times a day (QID) | INTRAMUSCULAR | Status: DC | PRN

## 2018-05-04 MED ORDER — HYDROCODONE-ACETAMINOPHEN 5-325 MG PO TABS: 1.0000 | ORAL_TABLET | ORAL | Status: DC | PRN

## 2018-05-04 MED ORDER — TRAMADOL HCL 50 MG PO TABS
50.0000 mg | ORAL_TABLET | Freq: Four times a day (QID) | ORAL | Status: DC | PRN
  Administered 2018-05-04: 50 mg via ORAL
  Filled 2018-05-04: qty 1

## 2018-05-04 MED ORDER — ACETAMINOPHEN 650 MG RE SUPP: 650.0000 mg | Freq: Four times a day (QID) | RECTAL | Status: DC | PRN

## 2018-05-04 MED ORDER — AMOXICILLIN-POT CLAVULANATE 875-125 MG PO TABS: 1.0000 | ORAL_TABLET | Freq: Two times a day (BID) | ORAL | 0 refills | Status: DC

## 2018-05-04 MED ORDER — TRAMADOL HCL 50 MG PO TABS: 50.0000 mg | ORAL_TABLET | Freq: Four times a day (QID) | ORAL | 0 refills | Status: DC | PRN

## 2018-05-04 MED ORDER — IBUPROFEN 400 MG PO TABS: 400.0000 mg | ORAL_TABLET | Freq: Four times a day (QID) | ORAL | 0 refills | Status: AC

## 2018-05-04 MED ORDER — LIP MEDEX EX OINT
TOPICAL_OINTMENT | CUTANEOUS | Status: AC
  Filled 2018-05-04: qty 7

## 2018-05-04 MED ORDER — HYDROMORPHONE HCL 1 MG/ML IJ SOLN: 1.0000 mg | INTRAMUSCULAR | Status: DC | PRN

## 2018-05-04 MED ORDER — ONDANSETRON 4 MG PO TBDP: 4.0000 mg | ORAL_TABLET | Freq: Four times a day (QID) | ORAL | Status: DC | PRN

## 2018-05-04 MED ORDER — SODIUM CHLORIDE 0.9 % IV SOLN
2.0000 g | INTRAVENOUS | Status: DC
  Filled 2018-05-04: qty 20

## 2018-05-04 MED ORDER — BUSPIRONE HCL 5 MG PO TABS
15.0000 mg | ORAL_TABLET | Freq: Two times a day (BID) | ORAL | Status: DC
  Administered 2018-05-04: 15 mg via ORAL
  Filled 2018-05-04 (×2): qty 3

## 2018-05-04 MED ORDER — IBUPROFEN 400 MG PO TABS
400.0000 mg | ORAL_TABLET | Freq: Four times a day (QID) | ORAL | Status: DC
  Administered 2018-05-04 (×2): 400 mg via ORAL
  Filled 2018-05-04 (×2): qty 1

## 2018-05-04 MED ORDER — METRONIDAZOLE IN NACL 5-0.79 MG/ML-% IV SOLN
500.0000 mg | Freq: Three times a day (TID) | INTRAVENOUS | Status: DC
  Administered 2018-05-04 (×2): 500 mg via INTRAVENOUS
  Filled 2018-05-04 (×2): qty 100

## 2018-05-04 MED ORDER — TRAMADOL HCL 50 MG PO TABS: 50.0000 mg | ORAL_TABLET | Freq: Four times a day (QID) | ORAL | Status: DC | PRN

## 2018-05-04 MED ORDER — ACETAMINOPHEN 325 MG PO TABS: 650.0000 mg | ORAL_TABLET | Freq: Four times a day (QID) | ORAL | 0 refills | Status: AC

## 2018-05-04 MED ORDER — ACETAMINOPHEN 325 MG PO TABS
650.0000 mg | ORAL_TABLET | Freq: Four times a day (QID) | ORAL | Status: DC
  Administered 2018-05-04 (×2): 650 mg via ORAL
  Filled 2018-05-04 (×2): qty 2

## 2018-05-04 MED ORDER — ENOXAPARIN SODIUM 40 MG/0.4ML ~~LOC~~ SOLN
40.0000 mg | SUBCUTANEOUS | Status: DC
  Administered 2018-05-04: 40 mg via SUBCUTANEOUS
  Filled 2018-05-04: qty 0.4

## 2018-05-04 MED ORDER — ACETAMINOPHEN 650 MG RE SUPP: 650.0000 mg | Freq: Four times a day (QID) | RECTAL | Status: DC

## 2018-05-04 MED ORDER — MIRTAZAPINE 15 MG PO TABS
45.0000 mg | ORAL_TABLET | Freq: Every day | ORAL | Status: DC
  Administered 2018-05-04: 45 mg via ORAL
  Filled 2018-05-04 (×2): qty 3

## 2018-05-04 MED ORDER — PRAMIPEXOLE DIHYDROCHLORIDE 0.25 MG PO TABS
0.2500 mg | ORAL_TABLET | Freq: Every day | ORAL | Status: DC
  Administered 2018-05-04: 0.25 mg via ORAL
  Filled 2018-05-04: qty 1

## 2018-05-04 MED ORDER — TOPIRAMATE 100 MG PO TABS
100.0000 mg | ORAL_TABLET | Freq: Two times a day (BID) | ORAL | Status: DC
  Administered 2018-05-04 (×2): 100 mg via ORAL
  Filled 2018-05-04 (×2): qty 1

## 2018-05-04 MED ORDER — BUPROPION HCL ER (XL) 150 MG PO TB24
450.0000 mg | ORAL_TABLET | Freq: Every day | ORAL | Status: DC
  Administered 2018-05-04: 450 mg via ORAL
  Filled 2018-05-04 (×2): qty 3

## 2018-05-04 MED ORDER — KCL IN DEXTROSE-NACL 20-5-0.45 MEQ/L-%-% IV SOLN
INTRAVENOUS | Status: DC
  Administered 2018-05-04: 02:00:00 via INTRAVENOUS
  Filled 2018-05-04: qty 1000

## 2018-05-04 MED ORDER — ACETAMINOPHEN 325 MG PO TABS: 650.0000 mg | ORAL_TABLET | Freq: Four times a day (QID) | ORAL | Status: DC | PRN

## 2018-05-04 NOTE — Discharge Summary (Signed)
[facility] Discharge Summary   Patient ID: Nancy Patel MRN: [id number] DOB/AGE: 44-Jul-1977 44 y.o.  Admit date: 05/03/2018 Discharge date: 05/04/2018  Admitting Diagnosis: Appendicitis   Discharge Diagnosis Patient Active Problem List   Diagnosis Date Noted  . Appendicitis, acute 05/03/2018  . Acute appendicitis 05/03/2018    Consultants none  Imaging: Ct Abdomen Pelvis W Contrast  Result Date: 05/03/2018 CLINICAL DATA:  Abdominal pain starting at 1 a.m. today. Nausea and diarrhea. Evaluate for appendicitis. EXAM: CT ABDOMEN AND PELVIS WITH CONTRAST TECHNIQUE: Multidetector CT imaging of the abdomen and pelvis was performed using the standard protocol following bolus administration of intravenous contrast. CONTRAST:  ISOVUE-300 IOPAMIDOL (ISOVUE-300) INJECTION 61% COMPARISON:  None. FINDINGS: Lower chest: No acute abnormality. Hepatobiliary: There is a 11 mm cyst in the hepatic dome on series 2, image 9. There is ill-defined low-attenuation in the posterior dome on series 2, image 12 measuring up to 8 mm. There is slight increased attenuation in this region on the delayed image. No other liver lesions. The gallbladder is normal. The portal vein is normal. Pancreas: Unremarkable. No pancreatic ductal dilatation or surrounding inflammatory changes. Spleen: Normal in size without focal abnormality. Adrenals/Urinary Tract: Adrenal glands are unremarkable. Kidneys are normal, without renal calculi, focal lesion, or hydronephrosis. Bladder is unremarkable. Stomach/Bowel: The stomach is normal. The colon is normal. The appendix is abnormal with increased caliber measuring up to 11 mm, wall thickening, and adjacent fat stranding. No appendicoliths. The appendix is fluid-filled. There may be mild secondary inflammation of the distal ileum. The small bowel is otherwise normal. Vascular/Lymphatic: No significant vascular findings are present. No enlarged abdominal or pelvic  lymph nodes. Reproductive: There is an IUD in the uterus. The right ovary is normal. There is a dominant follicle in the left ovary measuring up to 4.2 cm. Other: There is fluid in the pelvis which could be reactive fluid from the appendicitis or physiologic fluid given patient age. Musculoskeletal: No acute or significant osseous findings. IMPRESSION: 1. Appendicitis. Appendix: Location: Right side of the pelvis. Diameter: 11 mm Appendicolith: No Mucosal hyper-enhancement: No Extraluminal gas: No Periappendiceal collection: No 2. 8 mm nonspecific low-attenuation lesion in the hepatic dome may represent a small hemangioma but can not be characterized on this study. Recommend an ultrasound for further evaluation when the patient is able. 3. Dominant follicle measuring 4.2 cm in the left ovary. Electronically Signed   By: Gerome Sam III M.D   On: 05/03/2018 20:21    Procedures Dr. Gerrit Friends (05/03/2018) - Laparoscopic Appendectomy  HPI: Patient is a 43 year old female who presents to the emergency department on referral from her primary care physician's office with less than 24-hour history of abdominal pain localizing to the right lower quadrant associated with nausea.  Evaluation included laboratory studies showing an elevated white blood cell count of 19.1.  CT scan of the abdomen and pelvis demonstrates findings consistent with acute appendicitis.  Patient has had no prior abdominal surgery.  Patient denies any medication allergies.  She denies any other significant medical problems other than depression.  Hospital Course:  Patient was admitted and underwent procedure listed above.  Tolerated procedure well and was transferred to the floor.  Diet was advanced as tolerated.  On POD#1, the patient was voiding well, tolerating diet, ambulating well, pain well controlled, vital signs stable, incisions c/d/i and felt stable for discharge home.  Patient will follow up as outlined below and knows to call  with questions or concerns.  Patient was discharged in good condition.  Physical Exam: See progress note by Carlena Bjornstad, PA-C from 01/31  Allergies as of 05/04/2018   No Known Allergies     Medication List    TAKE these medications   acetaminophen 325 MG tablet Commonly known as:  TYLENOL Take 2 tablets (650 mg total) by mouth every 6 (six) hours for 3 days.   amoxicillin-clavulanate 875-125 MG tablet Commonly known as:  AUGMENTIN Take 1 tablet by mouth 2 (two) times daily.   busPIRone 30 MG tablet Commonly known as:  BUSPAR Take 15 mg by mouth 2 (two) times daily.   FORFIVO XL 450 MG Tb24 Generic drug:  buPROPion HCl ER (XL) Take 1 tablet by mouth daily.   ibuprofen 400 MG tablet Commonly known as:  ADVIL,MOTRIN Take 1 tablet (400 mg total) by mouth every 6 (six) hours for 3 days.   levonorgestrel 20 MCG/24HR IUD Commonly known as:  MIRENA 1 each by Intrauterine route once.   mirtazapine 45 MG tablet Commonly known as:  REMERON Take 45 mg by mouth at bedtime.   pramipexole 0.25 MG tablet Commonly known as:  MIRAPEX Take 0.25 mg by mouth at bedtime.   topiramate 100 MG tablet Commonly known as:  TOPAMAX Take 100 mg by mouth 2 (two) times daily.   traMADol 50 MG tablet Commonly known as:  ULTRAM Take 1 tablet (50 mg total) by mouth every 6 (six) hours as needed for severe pain.   triamcinolone cream 0.1 % Commonly known as:  KENALOG Apply 1 application topically at bedtime.        Follow-up Information    [facility], [place]. Go on 05/17/2018.   Specialty:  General Surgery Why:  Your appointment is 02/13 at 2:30 pm. Please arrive 30 minutes early to check in and fill out paperwork. Bring photo ID and insurance information. Contact information: [address] [place] [address] [phone]          Signed: Joyce Copa [facility] 05/04/2018, 3:37 PM Pager:  [phone] Consults: [phone] Mon-Fri 7:00 am-4:30 pm Sat-Sun 7:00 am-11:30 am

## 2018-05-04 NOTE — Discharge Instructions (Signed)
[facility] ° °Please arrive at least 30 min before your appointment to complete your check in paperwork.  If you are unable to arrive 30 min prior to your appointment time we may have to cancel or reschedule you. °LAPAROSCOPIC SURGERY: POST OP INSTRUCTIONS °Always review your discharge instruction sheet given to you by the facility where your surgery was performed. °IF YOU HAVE DISABILITY OR FAMILY LEAVE FORMS, YOU MUST BRING THEM TO THE OFFICE FOR PROCESSING.   °DO NOT GIVE THEM TO YOUR DOCTOR. ° °PAIN CONTROL ° °1. First take acetaminophen (Tylenol) AND/or ibuprofen (Advil) to control your pain after surgery.  Follow directions on package.  Taking acetaminophen (Tylenol) and/or ibuprofen (Advil) regularly after surgery will help to control your pain and lower the amount of prescription pain medication you may need.  You should not take more than 4,000 mg (4 grams) of acetaminophen (Tylenol) in 24 hours.  You should not take ibuprofen (Advil), aleve, motrin, naprosyn or other NSAIDS if you have a history of stomach ulcers or chronic kidney disease.  °2. A prescription for pain medication may be given to you upon discharge.  Take your pain medication as prescribed, if you still have uncontrolled pain after taking acetaminophen (Tylenol) or ibuprofen (Advil). °3. Use ice packs to help control pain. °4. If you need a refill on your pain medication, please contact your pharmacy.  They will contact our office to request authorization. Prescriptions will not be filled after 5pm or on week-ends. ° °HOME MEDICATIONS °5. Take your usually prescribed medications unless otherwise directed. ° °DIET °6. You should follow a light diet the first few days after arrival home.  Be sure to include lots of fluids daily. Avoid fatty, fried foods.  ° °CONSTIPATION °7. It is common to experience some constipation after surgery and if you are taking pain medication.  Increasing fluid intake and taking a stool  softener (such as Colace) will usually help or prevent this problem from occurring.  A mild laxative (Milk of Magnesia or Miralax) should be taken according to package instructions if there are no bowel movements after 48 hours. ° °WOUND/INCISION CARE °8. Most patients will experience some swelling and bruising in the area of the incisions.  Ice packs will help.  Swelling and bruising can take several days to resolve.  °9. Unless discharge instructions indicate otherwise, follow guidelines below  °a. STERI-STRIPS - you may remove your outer bandages 48 hours after surgery, and you may shower at that time.  You have steri-strips (small skin tapes) in place directly over the incision.  These strips should be left on the skin for 7-10 days.   °b. DERMABOND/SKIN GLUE - you may shower in 24 hours.  The glue will flake off over the next 2-3 weeks. °10. Any sutures or staples will be removed at the office during your follow-up visit. ° °ACTIVITIES °11. You may resume regular (light) daily activities beginning the next day--such as daily self-care, walking, climbing stairs--gradually increasing activities as tolerated.  You may have sexual intercourse when it is comfortable.  Refrain from any heavy lifting or straining until approved by your doctor. °a. You may drive when you are no longer taking prescription pain medication, you can comfortably wear a seatbelt, and you can safely maneuver your car and apply brakes. ° °FOLLOW-UP °12. You should see your doctor in the office for a follow-up appointment approximately 2-3 weeks after your surgery.  You should have been given your post-op/follow-up appointment when   your surgery was scheduled.  If you did not receive a post-op/follow-up appointment, make sure that you call for this appointment within a day or two after you arrive home to insure a convenient appointment time. ° °OTHER INSTRUCTIONS ° °WHEN TO CALL YOUR DOCTOR: °1. Fever over 101.0 °2. Inability to  urinate °3. Continued bleeding from incision. °4. Increased pain, redness, or drainage from the incision. °5. Increasing abdominal pain ° °The clinic staff is available to answer your questions during regular business hours.  Please don’t hesitate to call and ask to speak to one of the nurses for clinical concerns.  If you have a medical emergency, go to the nearest emergency room or call 911.  A surgeon from [facility] is always on call at the hospital. °[address], [address], [place], [place]  [address] ? [address] [address], , [place]   [address] °[phone] ? [phone] ? FAX [phone] ° ° ° °

## 2018-05-04 NOTE — Progress Notes (Signed)
[facility] Progress Note  1 Day Post-Op  Subjective: CC-  Sore but comfortable this morning. States that she has not been OOB yet since surgery. She has not urinated but states that she feels like she will once she gets up to go to the bathroom. Abdominal pain worse when she coughs. Denies n/v. No flatus or BM. Tolerating clear liquids.  Objective: Vital signs in last 24 hours: Temp:  [97.5 F (36.4 C)-98.6 F (37 C)] 98.2 F (36.8 C) (01/31 0408) Pulse Rate:  [98-118] 98 (01/31 0408) Resp:  [13-20] 16 (01/31 0408) BP: (111-142)/(63-98) 111/63 (01/31 0408) SpO2:  [96 %-100 %] 96 % (01/31 0408) FiO2 (%):  [21 %] 21 % (01/30 2305) Weight:  [90.3 kg] 90.3 kg (01/30 1704) Last BM Date: 05/03/18  Intake/Output from previous day: 01/30 0701 - 01/31 0700 In: 3095.6 [P.O.:180; I.V.:1742; IV Piggyback:1173.6] Out: 210 [Urine:200; Blood:10] Intake/Output this shift: No intake/output data recorded.  PE: Gen:  Alert, NAD, pleasant HEENT: EOM's intact, pupils equal and round Card:  RRR Pulm:  effort normal Abd: Soft, ND, appropriately tender, +BS, lap incisions cdi Skin: warm and dry  Lab Results:  Recent Labs    05/03/18 1713  WBC 19.1*  HGB 13.7  HCT 42.1  PLT 223   BMET Recent Labs    05/03/18 1713  NA 135  K 3.4*  CL 112*  CO2 16*  GLUCOSE 125*  BUN 9  CREATININE 0.83  CALCIUM 9.0   PT/INR No results for input(s): LABPROT, INR in the last 72 hours. CMP     Component Value Date/Time   NA 135 05/03/2018 1713   K 3.4 (L) 05/03/2018 1713   CL 112 (H) 05/03/2018 1713   CO2 16 (L) 05/03/2018 1713   GLUCOSE 125 (H) 05/03/2018 1713   BUN 9 05/03/2018 1713   CREATININE 0.83 05/03/2018 1713   CALCIUM 9.0 05/03/2018 1713   PROT 6.8 05/03/2018 1713   ALBUMIN 4.0 05/03/2018 1713   AST 16 05/03/2018 1713   ALT 17 05/03/2018 1713   ALKPHOS 58 05/03/2018 1713   BILITOT 0.4 05/03/2018 1713   GFRNONAA >60 05/03/2018 1713   GFRAA >60 05/03/2018 1713    Lipase     Component Value Date/Time   LIPASE 34 05/03/2018 1713       Studies/Results: Ct Abdomen Pelvis W Contrast  Result Date: 05/03/2018 CLINICAL DATA:  Abdominal pain starting at 1 a.m. today. Nausea and diarrhea. Evaluate for appendicitis. EXAM: CT ABDOMEN AND PELVIS WITH CONTRAST TECHNIQUE: Multidetector CT imaging of the abdomen and pelvis was performed using the standard protocol following bolus administration of intravenous contrast. CONTRAST:  ISOVUE-300 IOPAMIDOL (ISOVUE-300) INJECTION 61% COMPARISON:  None. FINDINGS: Lower chest: No acute abnormality. Hepatobiliary: There is a 11 mm cyst in the hepatic dome on series 2, image 9. There is ill-defined low-attenuation in the posterior dome on series 2, image 12 measuring up to 8 mm. There is slight increased attenuation in this region on the delayed image. No other liver lesions. The gallbladder is normal. The portal vein is normal. Pancreas: Unremarkable. No pancreatic ductal dilatation or surrounding inflammatory changes. Spleen: Normal in size without focal abnormality. Adrenals/Urinary Tract: Adrenal glands are unremarkable. Kidneys are normal, without renal calculi, focal lesion, or hydronephrosis. Bladder is unremarkable. Stomach/Bowel: The stomach is normal. The colon is normal. The appendix is abnormal with increased caliber measuring up to 11 mm, wall thickening, and adjacent fat stranding. No appendicoliths. The appendix is fluid-filled. There may be  mild secondary inflammation of the distal ileum. The small bowel is otherwise normal. Vascular/Lymphatic: No significant vascular findings are present. No enlarged abdominal or pelvic lymph nodes. Reproductive: There is an IUD in the uterus. The right ovary is normal. There is a dominant follicle in the left ovary measuring up to 4.2 cm. Other: There is fluid in the pelvis which could be reactive fluid from the appendicitis or physiologic fluid given patient age.  Musculoskeletal: No acute or significant osseous findings. IMPRESSION: 1. Appendicitis. Appendix: Location: Right side of the pelvis. Diameter: 11 mm Appendicolith: No Mucosal hyper-enhancement: No Extraluminal gas: No Periappendiceal collection: No 2. 8 mm nonspecific low-attenuation lesion in the hepatic dome may represent a small hemangioma but can not be characterized on this study. Recommend an ultrasound for further evaluation when the patient is able. 3. Dominant follicle measuring 4.2 cm in the left ovary. Electronically Signed   By: Gerome Samavid  Williams III M.D   On: 05/03/2018 20:21    Anti-infectives: Anti-infectives (From admission, onward)   Start     Dose/Rate Route Frequency Ordered Stop   05/04/18 2200  cefTRIAXone (ROCEPHIN) 2 g in sodium chloride 0.9 % 100 mL IVPB     2 g 200 mL/hr over 30 Minutes Intravenous Every 24 hours 05/04/18 0054     05/04/18 0600  metroNIDAZOLE (FLAGYL) IVPB 500 mg     500 mg 100 mL/hr over 60 Minutes Intravenous Every 8 hours 05/04/18 0054     05/03/18 2045  cefTRIAXone (ROCEPHIN) 2 g in sodium chloride 0.9 % 100 mL IVPB     2 g 200 mL/hr over 30 Minutes Intravenous  Once 05/03/18 2041 05/03/18 2132   05/03/18 2045  metroNIDAZOLE (FLAGYL) IVPB 500 mg     500 mg 100 mL/hr over 60 Minutes Intravenous  Once 05/03/18 2041 05/03/18 2242       Assessment/Plan Migraines Anxiety/depression  Acute appendicitis S/p laparoscopic appendectomy 1/30 Dr. Gerrit FriendsGerkin - POD 1 - Advance to regular diet. Needs to get OOB/ambulate. Schedule tylenol/ibuprofen for better pain control. Will recheck later today for possible discharge pending that she urinates, tolerates diet, and pain is controlled on oral regimen. Augmentin and pain rx sent to pharmacy. Discharge instructions and f/u info on AVS.  ID - rocephin/flagyl 1/30>>. Needs 5 days of augmentin at discharge. FEN - IVF, reg diet VTE - SCDs, lovenox Foley - none Follow up - [facility]   LOS: 0 days    [name]   , [facility] 05/04/2018, 8:27 AM Pager: [phone] Mon-Thurs 7:00 am-4:30 pm Fri 7:00 am -11:30 AM Sat-Sun 7:00 am-11:30 am

## 2018-05-15 ENCOUNTER — Emergency Department (HOSPITAL_COMMUNITY): Payer: BLUE CROSS/BLUE SHIELD

## 2018-05-15 ENCOUNTER — Other Ambulatory Visit: Payer: Self-pay

## 2018-05-15 ENCOUNTER — Encounter (HOSPITAL_COMMUNITY): Payer: Self-pay | Admitting: Emergency Medicine

## 2018-05-15 ENCOUNTER — Observation Stay (HOSPITAL_COMMUNITY)
Admission: EM | Admit: 2018-05-15 | Discharge: 2018-05-16 | Disposition: A | Discharge status: Home / Self Care | Payer: BLUE CROSS/BLUE SHIELD | Attending: Internal Medicine | Admitting: Internal Medicine

## 2018-05-15 DIAGNOSIS — I2694 Multiple subsegmental pulmonary emboli without acute cor pulmonale: Secondary | ICD-10-CM | POA: Diagnosis present

## 2018-05-15 DIAGNOSIS — D72829 Elevated white blood cell count, unspecified: Secondary | ICD-10-CM | POA: Diagnosis not present

## 2018-05-15 DIAGNOSIS — R9431 Abnormal electrocardiogram [ECG] [EKG]: Secondary | ICD-10-CM | POA: Diagnosis not present

## 2018-05-15 DIAGNOSIS — R000 Tachycardia, unspecified: Secondary | ICD-10-CM | POA: Diagnosis not present

## 2018-05-15 DIAGNOSIS — E876 Hypokalemia: Secondary | ICD-10-CM | POA: Diagnosis not present

## 2018-05-15 DIAGNOSIS — Z7901 Long term (current) use of anticoagulants: Secondary | ICD-10-CM | POA: Diagnosis not present

## 2018-05-15 DIAGNOSIS — I2699 Other pulmonary embolism without acute cor pulmonale: Secondary | ICD-10-CM | POA: Diagnosis present

## 2018-05-15 DIAGNOSIS — Z7952 Long term (current) use of systemic steroids: Secondary | ICD-10-CM | POA: Insufficient documentation

## 2018-05-15 DIAGNOSIS — Z793 Long term (current) use of hormonal contraceptives: Secondary | ICD-10-CM | POA: Insufficient documentation

## 2018-05-15 DIAGNOSIS — Z79899 Other long term (current) drug therapy: Secondary | ICD-10-CM | POA: Diagnosis not present

## 2018-05-15 LAB — CBC WITH DIFFERENTIAL/PLATELET
Abs Immature Granulocytes: 0.09 10*3/uL — ABNORMAL HIGH (ref 0.00–0.07)
Basophils Absolute: 0.1 10*3/uL (ref 0.0–0.1)
Basophils Relative: 0 %
Eosinophils Absolute: 0.1 10*3/uL (ref 0.0–0.5)
Eosinophils Relative: 1 %
HCT: 39.3 % (ref 36.0–46.0)
Hemoglobin: 12.5 g/dL (ref 12.0–15.0)
Immature Granulocytes: 1 %
Lymphocytes Relative: 16 %
Lymphs Abs: 2.4 10*3/uL (ref 0.7–4.0)
MCH: 29.1 pg (ref 26.0–34.0)
MCHC: 31.8 g/dL (ref 30.0–36.0)
MCV: 91.4 fL (ref 80.0–100.0)
Monocytes Absolute: 0.7 10*3/uL (ref 0.1–1.0)
Monocytes Relative: 5 %
Neutro Abs: 11.3 10*3/uL — ABNORMAL HIGH (ref 1.7–7.7)
Neutrophils Relative %: 77 %
Platelets: 314 10*3/uL (ref 150–400)
RBC: 4.3 MIL/uL (ref 3.87–5.11)
RDW: 12.2 % (ref 11.5–15.5)
WBC: 14.6 10*3/uL — ABNORMAL HIGH (ref 4.0–10.5)
nRBC: 0 % (ref 0.0–0.2)

## 2018-05-15 LAB — COMPREHENSIVE METABOLIC PANEL
ALT: 20 U/L (ref 0–44)
AST: 15 U/L (ref 15–41)
Albumin: 3.9 g/dL (ref 3.5–5.0)
Alkaline Phosphatase: 64 U/L (ref 38–126)
Anion gap: 9 (ref 5–15)
BUN: 11 mg/dL (ref 6–20)
CO2: 22 mmol/L (ref 22–32)
Calcium: 9.2 mg/dL (ref 8.9–10.3)
Chloride: 109 mmol/L (ref 98–111)
Creatinine, Ser: 0.82 mg/dL (ref 0.44–1.00)
GFR calc Af Amer: >60 mL/min (ref >60)
GFR calc non Af Amer: >60 mL/min (ref >60)
Glucose, Bld: 108 mg/dL — ABNORMAL HIGH (ref 70–99)
Potassium: 3.2 mmol/L — ABNORMAL LOW (ref 3.5–5.1)
Sodium: 140 mmol/L (ref 135–145)
Total Bilirubin: 0.1 mg/dL — ABNORMAL LOW (ref 0.3–1.2)
Total Protein: 7.5 g/dL (ref 6.5–8.1)

## 2018-05-15 LAB — URINALYSIS, ROUTINE W REFLEX MICROSCOPIC
Bilirubin Urine: NEGATIVE
Glucose, UA: NEGATIVE mg/dL
Hgb urine dipstick: NEGATIVE
Ketones, ur: NEGATIVE mg/dL
Nitrite: NEGATIVE
Protein, ur: NEGATIVE mg/dL
Specific Gravity, Urine: 1.014 (ref 1.005–1.030)
pH: 6 (ref 5.0–8.0)

## 2018-05-15 LAB — TROPONIN I: Troponin I: <0.03 ng/mL (ref <0.03)

## 2018-05-15 LAB — PREGNANCY, URINE: Preg Test, Ur: NEGATIVE

## 2018-05-15 MED ORDER — RIVAROXABAN 15 MG PO TABS
15.0000 mg | ORAL_TABLET | Freq: Once | ORAL | Status: AC
  Administered 2018-05-15: 15 mg via ORAL
  Filled 2018-05-15: qty 1

## 2018-05-15 MED ORDER — POTASSIUM CHLORIDE CRYS ER 20 MEQ PO TBCR
40.0000 meq | EXTENDED_RELEASE_TABLET | Freq: Once | ORAL | Status: AC
  Administered 2018-05-15: 40 meq via ORAL
  Filled 2018-05-15: qty 2

## 2018-05-15 MED ORDER — IOPAMIDOL (ISOVUE-370) INJECTION 76%
INTRAVENOUS | Status: AC
  Filled 2018-05-15: qty 100

## 2018-05-15 MED ORDER — SODIUM CHLORIDE 0.9 % IV BOLUS
1000.0000 mL | Freq: Once | INTRAVENOUS | Status: AC
  Administered 2018-05-15: 1000 mL via INTRAVENOUS

## 2018-05-15 MED ORDER — SODIUM CHLORIDE (PF) 0.9 % IJ SOLN
INTRAMUSCULAR | Status: AC
  Filled 2018-05-15: qty 50

## 2018-05-15 MED ORDER — IOPAMIDOL (ISOVUE-370) INJECTION 76%
100.0000 mL | Freq: Once | INTRAVENOUS | Status: AC | PRN
  Administered 2018-05-15: 100 mL via INTRAVENOUS

## 2018-05-15 MED ORDER — RIVAROXABAN (XARELTO) VTE STARTER PACK (15 & 20 MG): ORAL_TABLET | ORAL | 0 refills | Status: DC

## 2018-05-15 MED ORDER — RIVAROXABAN (XARELTO) EDUCATION KIT FOR DVT/PE PATIENTS
PACK | Freq: Once | Status: AC
  Administered 2018-05-15: 23:00:00
  Filled 2018-05-15: qty 1

## 2018-05-15 NOTE — ED Provider Notes (Signed)
[facility] DEPT Provider Note   CSN: [id number] Arrival date & time: 05/15/18  1445     History   Chief Complaint Chief Complaint  Patient presents with  . hurts when breathes    HPI Nancy Patel is a 43 y.o. female.  HPI   Nancy Patel is a 43 y.o. female, patient with no pertinent past medical history, presenting to the ED with painful breathing beginning 2 days ago.  She had pain in her left mid back that came on suddenly, felt like a tightness, increased to severe, sharp stabbing with deep inhalation, radiating to the left side of the chest.  Pain ranges between 2-7/10.  Intermittent nonproductive cough, especially with deep inhalation. She also notes she had an appendectomy performed January 30.    Denies history of PE/DVT.  She has an IUD in place.  Denies fever/chills, shortness of breath, lower extremity swelling/color change/pain, N/V/D, abdominal pain, anterior chest pain, hematuria, dysuria, dizziness, or any other complaints.  Past Medical History:  Diagnosis Date  . Medical history non-contributory     Patient Active Problem List   Diagnosis Date Noted  . Hypokalemia 05/16/2018  . Pulmonary emboli ([place]) 05/16/2018  . EKG abnormalities 05/16/2018  . Appendicitis, acute 05/03/2018  . Acute appendicitis 05/03/2018    Past Surgical History:  Procedure Laterality Date  . FRACTURE SURGERY    . GANGLION CYST EXCISION    . GANGLION CYST EXCISION Left 2003  . LAPAROSCOPIC APPENDECTOMY N/A 05/03/2018   Procedure: APPENDECTOMY LAPAROSCOPIC;  Surgeon: Armandina Gemma, MD;  Location: [facility] ORS;  Service: General;  Laterality: N/A;  . WRIST FRACTURE SURGERY Right 2010     OB History   No obstetric history on file.      Home Medications    Prior to Admission medications   Medication Sig Start Date End Date Taking? Authorizing Provider  albuterol (PROVENTIL HFA;VENTOLIN HFA) 108 (90 Base) MCG/ACT inhaler Inhale 2 puffs into the  lungs every 4 (four) hours as needed for wheezing or shortness of breath. 05/13/18 06/12/18 Yes [provider]  azithromycin (ZITHROMAX) 250 MG tablet Take 250-500 mg by mouth. Take as directed on package 05/13/18  Yes [provider]  benzonatate (TESSALON) 100 MG capsule Take 100 mg by mouth 3 (three) times daily. 05/13/18 05/23/18 Yes [provider]  busPIRone (BUSPAR) 30 MG tablet Take 15 mg by mouth 2 (two) times daily.  04/15/18  Yes [provider]  FORFIVO XL 450 MG TB24 Take 1 tablet by mouth daily.  04/24/18  Yes [provider]  levonorgestrel (MIRENA) 20 MCG/24HR IUD 1 each by Intrauterine route once.   Yes [provider]  mirtazapine (REMERON) 45 MG tablet Take 45 mg by mouth at bedtime.  04/23/18  Yes [provider]  pramipexole (MIRAPEX) 0.25 MG tablet Take 0.25 mg by mouth at bedtime.  01/16/18  Yes [provider]  predniSONE (DELTASONE) 5 MG tablet Take 5 mg by mouth. Taper down as directed on package 05/13/18 05/19/18 Yes [provider]  topiramate (TOPAMAX) 100 MG tablet Take 100 mg by mouth 2 (two) times daily.  02/18/18  Yes [provider]  traMADol (ULTRAM) 50 MG tablet Take 1 tablet (50 mg total) by mouth every 6 (six) hours as needed for severe pain. 05/04/18  Yes Meuth, Brooke A, PA-C  tretinoin (RETIN-A) 0.1 % cream Apply 1 application topically at bedtime.   Yes [provider]  Rivaroxaban 15 & 20 MG TBPK Take  as directed on package: Start with one '15mg'$  tablet by mouth twice a day with food. On Day 22, switch to one '20mg'$  tablet once a day with food. 05/15/18   Tayten Heber, Helane Gunther, PA-C    Family History No family history on file.  Social History Social History   Tobacco Use  . Smoking status: Never Smoker  . Smokeless tobacco: Never Used  Substance Use Topics  . Alcohol use: Yes    Alcohol/week: 2.0 standard drinks    Types: 2 Glasses of wine per week    Comment: per week  .  Drug use: Never     Allergies   Patient has no known allergies.   Review of Systems Review of Systems  Constitutional: Negative for chills, diaphoresis and fever.  Respiratory: Negative for shortness of breath.        Pain with deep inhalation  Cardiovascular: Negative for chest pain and leg swelling.  Gastrointestinal: Negative for abdominal pain, diarrhea, nausea and vomiting.  Genitourinary: Negative for dysuria, frequency, hematuria, vaginal bleeding and vaginal discharge.  Neurological: Negative for weakness.  All other systems reviewed and are negative.    Physical Exam Updated Vital Signs BP (!) 142/96   Pulse (!) 106   Temp 98.2 F (36.8 C) (Oral)   Resp 16   Ht '5\' 8"'$  (1.727 m)   Wt 86.2 kg   SpO2 100%   BMI 28.89 kg/m   Physical Exam Vitals signs and nursing note reviewed.  Constitutional:      General: She is not in acute distress.    Appearance: She is well-developed. She is not diaphoretic.  HENT:     Head: Normocephalic and atraumatic.     Mouth/Throat:     Mouth: Mucous membranes are moist.     Pharynx: Oropharynx is clear.  Eyes:     Conjunctiva/sclera: Conjunctivae normal.  Neck:     Musculoskeletal: Neck supple.  Cardiovascular:     Rate and Rhythm: Regular rhythm. Tachycardia present.     Pulses: Normal pulses.     Heart sounds: Normal heart sounds.  Pulmonary:     Effort: Pulmonary effort is normal. No respiratory distress.     Breath sounds: Normal breath sounds.  Abdominal:     Palpations: Abdomen is soft.     Tenderness: There is no abdominal tenderness. There is no right CVA tenderness, left CVA tenderness or guarding.  Musculoskeletal:     Right lower leg: No edema.     Left lower leg: No edema.  Lymphadenopathy:     Cervical: No cervical adenopathy.  Skin:    General: Skin is warm and dry.  Neurological:     Mental Status: She is alert.  Psychiatric:        Mood and Affect: Mood and affect normal.        Speech: Speech  normal.        Behavior: Behavior normal.      ED Treatments / Results  Labs (all labs ordered are listed, but only abnormal results are displayed) Labs Reviewed  CBC WITH DIFFERENTIAL/PLATELET - Abnormal; Notable for the following components:      Result Value   WBC 14.6 (*)    Neutro Abs 11.3 (*)    Abs Immature Granulocytes 0.09 (*)    All other components within normal limits  COMPREHENSIVE METABOLIC PANEL - Abnormal; Notable for the following components:   Potassium 3.2 (*)    Glucose, Bld 108 (*)    Total Bilirubin  0.1 (*)    All other components within normal limits  URINALYSIS, ROUTINE W REFLEX MICROSCOPIC - Abnormal; Notable for the following components:   APPearance CLOUDY (*)    Leukocytes,Ua SMALL (*)    Bacteria, UA RARE (*)    All other components within normal limits  PREGNANCY, URINE  TROPONIN I    EKG EKG Interpretation  Date/Time:  Tuesday May 15 2018 20:27:51 EST Ventricular Rate:  97 PR Interval:    QRS Duration: 93 QT Interval:  432 QTC Calculation: 549 R Axis:   33 Text Interpretation:  Sinus rhythm Borderline repolarization abnormality Prolonged QT interval No old tracing to compare Confirmed by Deno Etienne [phone]) on 05/15/2018 10:19:48 PM   EKG Interpretation  Date/Time:  Tuesday May 15 2018 22:52:09 EST Ventricular Rate:  97 PR Interval:    QRS Duration: 85 QT Interval:  391 QTC Calculation: 497 R Axis:   34 Text Interpretation:  Sinus rhythm Nonspecific T abnrm, anterolateral leads Borderline prolonged QT interval ? st depression in II, v4,v5 Confirmed by Deno Etienne [phone]) on 05/16/2018 12:02:24 AM       Radiology Dg Chest 2 View  Result Date: 05/15/2018 CLINICAL DATA:  Chest pain with breathing for a few days. Pain in the left chest markedly worsened today. EXAM: CHEST - 2 VIEW COMPARISON:  None. FINDINGS: The lungs are clear. No pneumothorax or pleural fluid. Heart size is normal. No bony abnormality. IMPRESSION: Negative  chest. Electronically Signed   By: Inge Rise M.D.   On: 05/15/2018 15:54   Ct Angio Chest Pe W And/or Wo Contrast  Result Date: 05/15/2018 CLINICAL DATA:  Pleuritic chest pain EXAM: CT ANGIOGRAPHY CHEST WITH CONTRAST TECHNIQUE: Multidetector CT imaging of the chest was performed using the standard protocol during bolus administration of intravenous contrast. Multiplanar CT image reconstructions and MIPs were obtained to evaluate the vascular anatomy. CONTRAST:  <See Chart> ISOVUE-370 IOPAMIDOL (ISOVUE-370) INJECTION 76% COMPARISON:  Chest x-ray today FINDINGS: Cardiovascular: Small pulmonary emboli noted within posterior basilar branches of the left pulmonary artery. No additional pulmonary emboli noted. No evidence of right heart strain. Mild cardiomegaly. Aorta is normal caliber. Mediastinum/Nodes: No mediastinal, hilar, or axillary adenopathy. Lungs/Pleura: Airspace disease noted at the left lung base could reflect atelectasis or early infarct. Trace left pleural effusion. No confluent opacity or effusion on the right. Upper Abdomen: Imaging into the upper abdomen shows no acute findings. Musculoskeletal: Chest wall soft tissues are unremarkable. No acute bony abnormality. Review of the MIP images confirms the above findings. IMPRESSION: Small pulmonary emboli within posterior basilar branches of the left pulmonary artery. Associated trace left effusion and airspace disease at the left lung base which could reflect atelectasis or early infarct. Cardiomegaly. Critical Value/emergent results were called by telephone at the time of interpretation on 05/15/2018 at 10:01 pm to Dr. Arlean Hopping , who verbally acknowledged these results. Electronically Signed   By: Rolm Baptise M.D.   On: 05/15/2018 22:04    Procedures Procedures (including critical care time)  Medications Ordered in ED Medications  sodium chloride (PF) 0.9 % injection (  Canceled Entry 05/15/18 2311)  sodium chloride 0.9 % bolus 1,000 mL  (0 mLs Intravenous Stopped 05/15/18 2258)  iopamidol (ISOVUE-370) 76 % injection 100 mL (100 mLs Intravenous Contrast Given 05/15/18 2141)  potassium chloride SA (K-DUR,KLOR-CON) CR tablet 40 mEq (40 mEq Oral Given 05/15/18 2253)  rivaroxaban [employer] Reasons) Education Kit for DVT/PE patients ( Does not apply Given 05/15/18 2311)  Rivaroxaban (XARELTO) tablet 15  mg (15 mg Oral Given 05/15/18 2311)     Initial Impression / Assessment and Plan / ED Course  I have reviewed the triage vital signs and the nursing notes.  Pertinent labs & imaging results that were available during my care of the patient were reviewed by me and considered in my medical decision making (see chart for details).  Clinical Course as of May 16 25  Wed May 16, 2018  0026 Spoke with Dr. Jonelle Sidle, hospitalist. Agrees to admit the patient.   [SJ]    Clinical Course User Index [SJ] Westley Blass C, PA-C    Patient presents with left mid to upper back pain.  Patient's recent surgery, her presenting tachycardia, presenting complaint, and pleuritic type chest pain  Negative troponin.  Patient ambulated without shortness of breath, chest pain, dizziness, or significant drop in SPO2 on room air. PESI score of 63, indicating very low risk, 0-1.6% 30 day mortality.  Due to this scoring, we originally engaged in shared decision-making for discharge. However, repeat EKG showed nonspecific T wave with as well as ST depression in lead II, V4, and V5.  Because of this, the more cautious plan of care would be to admit the patient for trended EKGs, troponins, and echo.   Findings and plan of care discussed with Deno Etienne, DO. Dr. Tyrone Nine personally evaluated and examined this patient.  Vitals:   05/15/18 2100 05/15/18 2130 05/15/18 2330 05/15/18 2358  BP: (!) 146/81 128/75 134/86 127/87  Pulse: 97 90 95 97  Resp: '16 18 14 17  '$ Temp:      TempSrc:      SpO2: 100% 100% 100% 100%  Weight:      Height:         Final Clinical Impressions(s) /  ED Diagnoses   Final diagnoses:  Multiple subsegmental pulmonary emboli without acute cor pulmonale    ED Discharge Orders         Ordered    Rivaroxaban 15 & 20 MG TBPK     05/15/18 2229           Lorayne Bender, PA-C 05/16/18 [place], [place], DO 05/16/18 1526

## 2018-05-15 NOTE — Discharge Instructions (Addendum)
There was evidence of pulmonary embolism on the CT scan. We have initiated a blood thinner medication to help prevent further clotting. You may expect to have continued symptoms at the same intensity for days to weeks. Follow-up with a primary care provider on this matter.   Return to the ED for chest pain, shortness of breath, dizziness, passing out, coughing up blood, or any other major complaints.  Information on my medicine - XARELTO (rivaroxaban)  This medication education was reviewed with me or my healthcare representative as part of my discharge preparation.  The pharmacist that spoke with me during my hospital stay was:    WHY WAS XARELTO PRESCRIBED FOR YOU? Xarelto was prescribed to treat blood clots that may have been found in the veins of your legs (deep vein thrombosis) or in your lungs (pulmonary embolism) and to reduce the risk of them occurring again.  What do you need to know about Xarelto? The starting dose is one 15 mg tablet taken TWICE daily with food for the FIRST 21 DAYS then on March 4th, 2020  the dose is changed to one 20 mg tablet taken ONCE A DAY with your evening meal.  DO NOT stop taking Xarelto without talking to the health care provider who prescribed the medication.  Refill your prescription for 20 mg tablets before you run out.  After discharge, you should have regular check-up appointments with your healthcare provider that is prescribing your Xarelto.  In the future your dose may need to be changed if your kidney function changes by a significant amount.  What do you do if you miss a dose? If you are taking Xarelto TWICE DAILY and you miss a dose, take it as soon as you remember. You may take two 15 mg tablets (total 30 mg) at the same time then resume your regularly scheduled 15 mg twice daily the next day.  If you are taking Xarelto ONCE DAILY and you miss a dose, take it as soon as you remember on the same day then continue your regularly  scheduled once daily regimen the next day. Do not take two doses of Xarelto at the same time.   Important Safety Information Xarelto is a blood thinner medicine that can cause bleeding. You should call your healthcare provider right away if you experience any of the following: ? Bleeding from an injury or your nose that does not stop. ? Unusual colored urine (red or dark brown) or unusual colored stools (red or black). ? Unusual bruising for unknown reasons. ? A serious fall or if you hit your head (even if there is no bleeding).  Some medicines may interact with Xarelto and might increase your risk of bleeding while on Xarelto. To help avoid this, consult your healthcare provider or pharmacist prior to using any new prescription or non-prescription medications, including herbals, vitamins, non-steroidal anti-inflammatory drugs (NSAIDs) and supplements.  This website has more information on Xarelto: VisitDestination.com.br.

## 2018-05-15 NOTE — ED Triage Notes (Signed)
Pt reports for couple days been having pains with breathing and been on z pack and was feeling better. But today had severe left lung pains when breathing. Reports she was at clinic and NP listened and told her she had crackles in left lower lobe. Pt reports had her appendix taken out couple weeks ago.

## 2018-05-15 NOTE — ED Notes (Signed)
Pt 94%-99% on pulse ox while ambulating

## 2018-05-16 ENCOUNTER — Ambulatory Visit (HOSPITAL_BASED_OUTPATIENT_CLINIC_OR_DEPARTMENT_OTHER): Payer: BLUE CROSS/BLUE SHIELD

## 2018-05-16 ENCOUNTER — Other Ambulatory Visit: Payer: Self-pay

## 2018-05-16 ENCOUNTER — Observation Stay (HOSPITAL_BASED_OUTPATIENT_CLINIC_OR_DEPARTMENT_OTHER): Payer: BLUE CROSS/BLUE SHIELD

## 2018-05-16 DIAGNOSIS — I2699 Other pulmonary embolism without acute cor pulmonale: Secondary | ICD-10-CM

## 2018-05-16 DIAGNOSIS — I2694 Multiple subsegmental pulmonary emboli without acute cor pulmonale: Secondary | ICD-10-CM

## 2018-05-16 DIAGNOSIS — I2693 Single subsegmental pulmonary embolism without acute cor pulmonale: Secondary | ICD-10-CM

## 2018-05-16 DIAGNOSIS — E876 Hypokalemia: Secondary | ICD-10-CM | POA: Diagnosis not present

## 2018-05-16 DIAGNOSIS — D72829 Elevated white blood cell count, unspecified: Secondary | ICD-10-CM | POA: Diagnosis present

## 2018-05-16 DIAGNOSIS — R9431 Abnormal electrocardiogram [ECG] [EKG]: Secondary | ICD-10-CM | POA: Diagnosis present

## 2018-05-16 LAB — COMPREHENSIVE METABOLIC PANEL
ALT: 16 U/L (ref 0–44)
AST: 14 U/L — ABNORMAL LOW (ref 15–41)
Albumin: 3.1 g/dL — ABNORMAL LOW (ref 3.5–5.0)
Alkaline Phosphatase: 49 U/L (ref 38–126)
Anion gap: 6 (ref 5–15)
BUN: 8 mg/dL (ref 6–20)
CO2: 20 mmol/L — ABNORMAL LOW (ref 22–32)
Calcium: 8.4 mg/dL — ABNORMAL LOW (ref 8.9–10.3)
Chloride: 113 mmol/L — ABNORMAL HIGH (ref 98–111)
Creatinine, Ser: 0.72 mg/dL (ref 0.44–1.00)
GFR calc Af Amer: >60 mL/min (ref >60)
GFR calc non Af Amer: >60 mL/min (ref >60)
Glucose, Bld: 90 mg/dL (ref 70–99)
Potassium: 3.5 mmol/L (ref 3.5–5.1)
Sodium: 139 mmol/L (ref 135–145)
Total Bilirubin: 0.2 mg/dL — ABNORMAL LOW (ref 0.3–1.2)
Total Protein: 6.2 g/dL — ABNORMAL LOW (ref 6.5–8.1)

## 2018-05-16 LAB — CBC
HCT: 35.3 % — ABNORMAL LOW (ref 36.0–46.0)
Hemoglobin: 11.2 g/dL — ABNORMAL LOW (ref 12.0–15.0)
MCH: 29.2 pg (ref 26.0–34.0)
MCHC: 31.7 g/dL (ref 30.0–36.0)
MCV: 92.2 fL (ref 80.0–100.0)
Platelets: 259 10*3/uL (ref 150–400)
RBC: 3.83 MIL/uL — ABNORMAL LOW (ref 3.87–5.11)
RDW: 12.3 % (ref 11.5–15.5)
WBC: 11.2 10*3/uL — ABNORMAL HIGH (ref 4.0–10.5)
nRBC: 0 % (ref 0.0–0.2)

## 2018-05-16 LAB — ECHOCARDIOGRAM COMPLETE
Height: 68 in
Weight: 3040 oz

## 2018-05-16 LAB — HIV ANTIBODY (ROUTINE TESTING W REFLEX): HIV Screen 4th Generation wRfx: NONREACTIVE

## 2018-05-16 MED ORDER — MIRTAZAPINE 15 MG PO TABS
45.0000 mg | ORAL_TABLET | Freq: Every day | ORAL | Status: DC
  Administered 2018-05-16: 45 mg via ORAL
  Filled 2018-05-16: qty 3

## 2018-05-16 MED ORDER — PRAMIPEXOLE DIHYDROCHLORIDE 0.25 MG PO TABS
0.2500 mg | ORAL_TABLET | Freq: Every day | ORAL | Status: DC
  Administered 2018-05-16: 0.25 mg via ORAL
  Filled 2018-05-16 (×2): qty 1

## 2018-05-16 MED ORDER — ONDANSETRON HCL 4 MG PO TABS: 4.0000 mg | ORAL_TABLET | Freq: Four times a day (QID) | ORAL | Status: DC | PRN

## 2018-05-16 MED ORDER — RIVAROXABAN 20 MG PO TABS: 20.0000 mg | ORAL_TABLET | Freq: Every day | ORAL | Status: DC

## 2018-05-16 MED ORDER — BUSPIRONE HCL 5 MG PO TABS
15.0000 mg | ORAL_TABLET | Freq: Two times a day (BID) | ORAL | Status: DC
  Administered 2018-05-16 (×2): 15 mg via ORAL
  Filled 2018-05-16 (×2): qty 1

## 2018-05-16 MED ORDER — ALBUTEROL SULFATE (2.5 MG/3ML) 0.083% IN NEBU: 2.5000 mg | INHALATION_SOLUTION | RESPIRATORY_TRACT | Status: DC | PRN

## 2018-05-16 MED ORDER — RIVAROXABAN 15 MG PO TABS
15.0000 mg | ORAL_TABLET | Freq: Two times a day (BID) | ORAL | Status: DC
  Administered 2018-05-16 (×2): 15 mg via ORAL
  Filled 2018-05-16 (×2): qty 1

## 2018-05-16 MED ORDER — MORPHINE SULFATE (PF) 2 MG/ML IV SOLN: 2.0000 mg | INTRAVENOUS | Status: DC | PRN

## 2018-05-16 MED ORDER — BENZONATATE 100 MG PO CAPS
100.0000 mg | ORAL_CAPSULE | Freq: Three times a day (TID) | ORAL | Status: DC
  Administered 2018-05-16 (×2): 100 mg via ORAL
  Filled 2018-05-16 (×3): qty 1

## 2018-05-16 MED ORDER — TRETINOIN 0.1 % EX CREA: 1.0000 "application " | TOPICAL_CREAM | Freq: Every day | CUTANEOUS | Status: DC

## 2018-05-16 MED ORDER — ONDANSETRON HCL 4 MG/2ML IJ SOLN: 4.0000 mg | Freq: Four times a day (QID) | INTRAMUSCULAR | Status: DC | PRN

## 2018-05-16 MED ORDER — LEVONORGESTREL 20 MCG/24HR IU IUD: 1.0000 | INTRAUTERINE_SYSTEM | Freq: Once | INTRAUTERINE | Status: DC

## 2018-05-16 MED ORDER — TRAMADOL HCL 50 MG PO TABS: 50.0000 mg | ORAL_TABLET | Freq: Four times a day (QID) | ORAL | Status: DC | PRN

## 2018-05-16 MED ORDER — BUPROPION HCL ER (XL) 150 MG PO TB24
450.0000 mg | ORAL_TABLET | Freq: Every day | ORAL | Status: DC
  Administered 2018-05-16: 450 mg via ORAL
  Filled 2018-05-16: qty 3

## 2018-05-16 MED ORDER — TOPIRAMATE 100 MG PO TABS
100.0000 mg | ORAL_TABLET | Freq: Two times a day (BID) | ORAL | Status: DC
  Administered 2018-05-16 (×2): 100 mg via ORAL
  Filled 2018-05-16 (×2): qty 1

## 2018-05-16 MED ORDER — ALBUTEROL SULFATE HFA 108 (90 BASE) MCG/ACT IN AERS: 2.0000 | INHALATION_SPRAY | RESPIRATORY_TRACT | Status: DC | PRN

## 2018-05-16 NOTE — Progress Notes (Signed)
ANTICOAGULATION CONSULT NOTE - Initial Consult  Pharmacy Consult for rivaroxaban Indication: pulmonary embolus  No Known Allergies  Patient Measurements: Height: 5\' 8"  (172.7 cm) Weight: 190 lb (86.2 kg) IBW/kg (Calculated) : 63.9  Vital Signs: Temp: 97.8 F (36.6 C) (02/12 0536) Temp Source: Oral (02/12 0536) BP: 110/73 (02/12 0536) Pulse Rate: 86 (02/12 0536)  Labs: Recent Labs    05/15/18 2038 05/15/18 2225 05/16/18 0416  HGB 12.5  --  11.2*  HCT 39.3  --  35.3*  PLT 314  --  259  CREATININE 0.82  --  0.72  TROPONINI  --  <0.03  --     Estimated Creatinine Clearance: 104.2 mL/min (by C-G formula based on SCr of 0.72 mg/dL).   Medical History: Past Medical History:  Diagnosis Date  . Medical history non-contributory    Assessment: Patient presented to ED last night with complaints of SOB and diagnosed with a pulmonary embolism. Pt received a dose of rivaroxaban 15 mg on 2/11 @ 2311. Pharmacy was then consulted to convert to heparin drip.   Discussed anticoagulation with MD this morning, received verbal order to continue with rivaroxaban treatment dosing for PE. Heparin consult discontinued.   Today, 05/16/18  Hgb 11.2, Plt 249  SCr 0.7, CrCl ~100 mL/min  Goal of Therapy:  Monitor platelets by anticoagulation protocol: Yes   Plan:   Rivaroxaban 15 mg PO BID with meals x 21 days followed by rivaroxaban 20 mg PO daily with dinner  Monitor for signs/symptoms of bleeding   Pt provided with manufacturer coupon  Medication counseling provided   Cindi Carbon, PharmD 05/16/2018,11:55 AM

## 2018-05-16 NOTE — ED Notes (Signed)
ED TO INPATIENT HANDOFF REPORT  Name/Age/Gender Nancy Patel 43 y.o. female  Code Status Code Status History    Date Active Date Inactive Code Status Order ID Comments User Context   05/03/2018 2148 05/04/2018 1928 Full Code [id number]  Armandina Gemma, MD ED    Advance Directive Documentation     Most Recent Value  Type of Advance Directive  Healthcare Power of Attorney  Pre-existing out of facility DNR order (yellow form or pink MOST form)  -  "MOST" Form in Place?  -      Home/SNF/Other Home  Chief Complaint post surgical pain in lungs  Level of Care/Admitting Diagnosis ED Disposition    ED Disposition Condition [place]: [place] [100102]  Level of Care: Telemetry [5]  Admit to tele based on following criteria: Complex arrhythmia (Bradycardia/Tachycardia)  Diagnosis: Pulmonary emboli ([place]) [638453]  Admitting Physician: Elwyn Reach [[id number]]  Attending Physician: Elwyn Reach [[id number]]  PT Class (Do Not Modify): Observation [104]  PT Acc Code (Do Not Modify): Observation [10022]       Medical History Past Medical History:  Diagnosis Date  . Medical history non-contributory     Allergies No Known Allergies  IV Location/Drains/Wounds Patient Lines/Drains/Airways Status   Active Line/Drains/Airways    Name:   Placement date:   Placement time:   Site:   Days:   Peripheral IV 05/15/18 Right Antecubital   05/15/18    2036    Antecubital   1   Incision (Closed) 05/03/18 Abdomen Other (Comment)   05/03/18    2317     13   Incision - 3 Ports Abdomen 1: Left;Lower 2: Umbilicus 3: Right;Superior   05/03/18    2254     13          Labs/Imaging Results for orders placed or performed during the hospital encounter of 05/15/18 (from the past 48 hour(s))  CBC with Differential     Status: Abnormal   Collection Time: 05/15/18  8:38 PM  Result Value Ref Range   WBC 14.6 (H) 4.0 - 10.5 K/uL   RBC 4.30 3.87 - 5.11 MIL/uL    Hemoglobin 12.5 12.0 - 15.0 g/dL   HCT 39.3 36.0 - 46.0 %   MCV 91.4 80.0 - 100.0 fL   MCH 29.1 26.0 - 34.0 pg   MCHC 31.8 30.0 - 36.0 g/dL   RDW 12.2 11.5 - 15.5 %   Platelets 314 150 - 400 K/uL   nRBC 0.0 0.0 - 0.2 %   Neutrophils Relative % 77 %   Neutro Abs 11.3 (H) 1.7 - 7.7 K/uL   Lymphocytes Relative 16 %   Lymphs Abs 2.4 0.7 - 4.0 K/uL   Monocytes Relative 5 %   Monocytes Absolute 0.7 0.1 - 1.0 K/uL   Eosinophils Relative 1 %   Eosinophils Absolute 0.1 0.0 - 0.5 K/uL   Basophils Relative 0 %   Basophils Absolute 0.1 0.0 - 0.1 K/uL   Immature Granulocytes 1 %   Abs Immature Granulocytes 0.09 (H) 0.00 - 0.07 K/uL    Comment: Performed at [facility], [place] [address], [place], [place] [address]  Comprehensive metabolic panel     Status: Abnormal   Collection Time: 05/15/18  8:38 PM  Result Value Ref Range   Sodium 140 135 - 145 mmol/L   Potassium 3.2 (L) 3.5 - 5.1 mmol/L   Chloride 109 98 - 111 mmol/L   CO2  22 22 - 32 mmol/L   Glucose, Bld 108 (H) 70 - 99 mg/dL   BUN 11 6 - 20 mg/dL   Creatinine, Ser 0.82 0.44 - 1.00 mg/dL   Calcium 9.2 8.9 - 10.3 mg/dL   Total Protein 7.5 6.5 - 8.1 g/dL   Albumin 3.9 3.5 - 5.0 g/dL   AST 15 15 - 41 U/L   ALT 20 0 - 44 U/L   Alkaline Phosphatase 64 38 - 126 U/L   Total Bilirubin 0.1 (L) 0.3 - 1.2 mg/dL   GFR calc non Af Amer >60 >60 mL/min   GFR calc Af Amer >60 >60 mL/min   Anion gap 9 5 - 15    Comment: Performed at [facility], [place] [address] [place], [place] [address]  Urinalysis, Routine w reflex microscopic     Status: Abnormal   Collection Time: 05/15/18  8:38 PM  Result Value Ref Range   Color, Urine YELLOW YELLOW   APPearance CLOUDY (A) CLEAR   Specific Gravity, Urine 1.014 1.005 - 1.030   pH 6.0 5.0 - 8.0   Glucose, UA NEGATIVE NEGATIVE mg/dL   Hgb urine dipstick NEGATIVE NEGATIVE   Bilirubin Urine NEGATIVE NEGATIVE   Ketones, ur NEGATIVE NEGATIVE mg/dL   Protein, ur  NEGATIVE NEGATIVE mg/dL   Nitrite NEGATIVE NEGATIVE   Leukocytes,Ua SMALL (A) NEGATIVE   RBC / HPF 0-5 0 - 5 RBC/hpf   WBC, UA 0-5 0 - 5 WBC/hpf   Bacteria, UA RARE (A) NONE SEEN   Squamous Epithelial / LPF 21-50 0 - 5   Mucus PRESENT    Hyaline Casts, UA PRESENT    Amorphous Crystal PRESENT     Comment: Performed at [facility], [place] [address] [place], [place] [address]  Pregnancy, urine     Status: None   Collection Time: 05/15/18  8:38 PM  Result Value Ref Range   Preg Test, Ur NEGATIVE NEGATIVE    Comment:        THE SENSITIVITY OF THIS METHODOLOGY IS >20 mIU/mL. Performed at [facility], [place] [address] [place], [place] [address]   Troponin I - Once     Status: None   Collection Time: 05/15/18 10:25 PM  Result Value Ref Range   Troponin I <0.03 <0.03 ng/mL    Comment: Performed at [facility], [place] [address], [place], [place] [address]   Dg Chest 2 View  Result Date: 05/15/2018 CLINICAL DATA:  Chest pain with breathing for a few days. Pain in the left chest markedly worsened today. EXAM: CHEST - 2 VIEW COMPARISON:  None. FINDINGS: The lungs are clear. No pneumothorax or pleural fluid. Heart size is normal. No bony abnormality. IMPRESSION: Negative chest. Electronically Signed   By: Inge Rise M.D.   On: 05/15/2018 15:54   Ct Angio Chest Pe W And/or Wo Contrast  Result Date: 05/15/2018 CLINICAL DATA:  Pleuritic chest pain EXAM: CT ANGIOGRAPHY CHEST WITH CONTRAST TECHNIQUE: Multidetector CT imaging of the chest was performed using the standard protocol during bolus administration of intravenous contrast. Multiplanar CT image reconstructions and MIPs were obtained to evaluate the vascular anatomy. CONTRAST:  <See Chart> ISOVUE-370 IOPAMIDOL (ISOVUE-370) INJECTION 76% COMPARISON:  Chest x-ray today FINDINGS: Cardiovascular: Small pulmonary emboli noted within posterior basilar branches of the left pulmonary artery. No  additional pulmonary emboli noted. No evidence of right heart strain. Mild cardiomegaly. Aorta is normal caliber. Mediastinum/Nodes: No mediastinal, hilar, or axillary adenopathy. Lungs/Pleura:  Airspace disease noted at the left lung base could reflect atelectasis or early infarct. Trace left pleural effusion. No confluent opacity or effusion on the right. Upper Abdomen: Imaging into the upper abdomen shows no acute findings. Musculoskeletal: Chest wall soft tissues are unremarkable. No acute bony abnormality. Review of the MIP images confirms the above findings. IMPRESSION: Small pulmonary emboli within posterior basilar branches of the left pulmonary artery. Associated trace left effusion and airspace disease at the left lung base which could reflect atelectasis or early infarct. Cardiomegaly. Critical Value/emergent results were called by telephone at the time of interpretation on 05/15/2018 at 10:01 pm to Dr. Arlean Hopping , who verbally acknowledged these results. Electronically Signed   By: Rolm Baptise M.D.   On: 05/15/2018 22:04    Pending Labs [employer] (From admission, onward)    Start     Ordered   Signed and Held  HIV antibody (Routine Testing)  Once,   R     Signed and Held   Signed and Held  Comprehensive metabolic panel  Tomorrow morning,   R     Signed and Held   Signed and Held  CBC  Tomorrow morning,   R     Signed and Held          Vitals/Pain Today's Vitals   05/15/18 2130 05/15/18 2330 05/15/18 2356 05/15/18 2358  BP: 128/75 134/86  127/87  Pulse: 90 95  97  Resp: _0 Temp:      TempSrc:      SpO2: 100% 100%  100%  Weight:      Height:      PainSc:   0-No pain     Isolation Precautions No active isolations  Medications Medications  sodium chloride (PF) 0.9 % injection (  Canceled Entry 05/15/18 2311)  sodium chloride 0.9 % bolus 1,000 mL (0 mLs Intravenous Stopped 05/15/18 2258)  iopamidol (ISOVUE-370) 76 % injection 100 mL (100 mLs Intravenous  Contrast Given 05/15/18 2141)  potassium chloride SA (K-DUR,KLOR-CON) CR tablet 40 mEq (40 mEq Oral Given 05/15/18 2253)  rivaroxaban (XARELTO) Education Kit for DVT/PE patients ( Does not apply Given 05/15/18 2311)  Rivaroxaban (XARELTO) tablet 15 mg (15 mg Oral Given 05/15/18 2311)    Mobility walks

## 2018-05-16 NOTE — Progress Notes (Signed)
ANTICOAGULATION CONSULT NOTE - Initial Consult  Pharmacy Consult for Heparin Indication: pulmonary embolus  No Known Allergies  Patient Measurements: Height: 5\' 8"  (172.7 cm) Weight: 190 lb (86.2 kg) IBW/kg (Calculated) : 63.9 Heparin Dosing Weight:   Vital Signs: Temp: 98.7 F (37.1 C) (02/12 0101) Temp Source: Oral (02/12 0101) BP: 141/86 (02/12 0101) Pulse Rate: 95 (02/12 0101)  Labs: Recent Labs    05/15/18 2038 05/15/18 2225  HGB 12.5  --   HCT 39.3  --   PLT 314  --   CREATININE 0.82  --   TROPONINI  --  <0.03    Estimated Creatinine Clearance: 101.7 mL/min (by C-G formula based on SCr of 0.82 mg/dL).   Medical History: Past Medical History:  Diagnosis Date  . Medical history non-contributory     Medications:  Infusions:    Assessment: Patient orders for heparin per pharmacy.  Patient has rivaroxaban charted ~2300 2/11.  Spoke with Dr. Mikeal Hawthorne and he agrees to not start heparin until 1100 2/12.    Goal of Therapy:  Heparin level 0.3-0.7 units/ml Monitor platelets by anticoagulation protocol: Yes   Plan:  F/u with anticoagulation plans by 1100 2/12.  Darlina Guys, Jacquenette Shone Crowford 05/16/2018,4:45 AM

## 2018-05-16 NOTE — Progress Notes (Signed)
Bilateral lower extremity venous duplex completed. Preliminary results found in chart review CV Proc. [employer], RVS 05/16/2018 11:56 AM

## 2018-05-16 NOTE — Progress Notes (Signed)
Pt discharged to home, instruction reviewed with patient. Acknowledged understanding. SRP, RN

## 2018-05-16 NOTE — Plan of Care (Signed)
  Problem: Phase I Progression Outcomes Goal: Pain controlled with appropriate interventions Outcome: Progressing   Problem: Phase I Progression Outcomes Goal: Dyspnea controlled at rest (PE) Outcome: Progressing   Problem: Phase I Progression Outcomes Goal: Tolerating diet Outcome: Progressing   Problem: Phase I Progression Outcomes Goal: Voiding-avoid urinary catheter unless indicated Outcome: Completed/Met

## 2018-05-16 NOTE — Discharge Summary (Signed)
Physician Discharge Summary  Nancy NoonKaren Patel LKG:[id number]:[id number] DOB: 11-15-1975 DOA: 05/15/2018  PCP: Laqueta DueFurr, Nancy M., MD  Admit date: 05/15/2018 Discharge date: 05/16/2018  Admitted From: Home  Discharge disposition: Home   Recommendations for Outpatient Follow-Up:   Follow up with your primary care physician in 1 week  Discharge Diagnosis:   Principal Problem:   Pulmonary emboli (HCC) Active Problems:   Hypokalemia   EKG abnormalities   Leucocytosis    Discharge Condition: Improved.  Diet recommendation:  Regular.  Wound care: None.  Code status: Full.   History of Present Illness:   HPI: Nancy NoonKaren Patel is a 43 y.o. female with medical history significant of recent appendicitis with appendectomy on January 30, bronchitis who presents to the ER with shortness of breath as well as cough.  Patient was being treated for bronchitis but not getting better.  In the ER, patient was found to have small pulmonary embolus.  Patient was evaluated and being discharged home but was noted to have abnormal EKG with the lateral leads have been T waves flipped and some T wave depression.  Patient was therefore being admitted overnight to evaluate for possible right heart strain or other cardiac abnormalities.  She denied any pain at this point.  She was not hypoxic and does not have any significant shortness of breath.  The patient received a dose of Xarelto and appeared to be stable..  ED Course: Temperature is 98 F, blood pressure was 70/100 pulse 114 respiratory of 18 her oxygen sat was 98% on room air.  White count was 14.6 with potassium 3.2 and glucose 108 otherwise the rest of the CBCs and CMP were within normal.  CT angiogram of the chest showed small pulmonary emboli within posterior basilar branches of the left pulmonary artery. Associated trace left effusion and airspace disease at the left lung base which could reflect atelectasis or early infarct.  Patient was initially given a dose of  Xarelto and was being discharged home.  Due to worrisome about abnormal EKG patient was admitted to the hospital for work-up.  Hospital Course:   Patient was placed in observation overnight.  She was given potassium supplements for hypokalemia which improved prior to discharge. 2D echocardiogram showed normal LV function without RV strain.  CT angiogram was negative for any RV strain.  Patient was continued on Xarelto.  Telemetry did not show any arrhythmia.  Patient remained hemodynamically stable and did not require any supplemental oxygen.  Lower extremity duplex ultrasound was also performed which did not show any evidence of DVT.  Patient was counseled precautions on blood thinners and the need for outpatient follow-up with primary care physician in 1 week.     Medical Consultants:    None.   Discharge Exam:   Vitals:   05/16/18 0536 05/16/18 1350  BP: 110/73 133/80  Pulse: 86 91  Resp: 19   Temp: 97.8 F (36.6 C) 97.7 F (36.5 C)  SpO2:  100%   Vitals:   05/15/18 2358 05/16/18 0101 05/16/18 0536 05/16/18 1350  BP: 127/87 (!) 141/86 110/73 133/80  Pulse: 97 95 86 91  Resp: 17 18 19    Temp:  98.7 F (37.1 C) 97.8 F (36.6 C) 97.7 F (36.5 C)  TempSrc:  Oral Oral Oral  SpO2: 100% 100%  100%  Weight:      Height:        General exam: Appears calm and comfortable.  Not in obvious distress  respiratory system: Clear to auscultation. Respiratory  effort normal. Cardiovascular system: S1 & S2 heard, RRR. No JVD,  rubs, gallops or clicks. No murmurs. Gastrointestinal system: Abdomen is nondistended, soft and nontender. No organomegaly or masses felt. Normal bowel sounds heard. Central nervous system: Alert and oriented. No focal neurological deficits. Extremities: No clubbing,  or cyanosis. No edema. Skin: No rashes, lesions or ulcers. Psychiatry: Judgement and insight appear normal. Mood & affect appropriate.    The results of significant diagnostics from this  hospitalization (including imaging, microbiology, ancillary and laboratory) are listed below for reference.     Procedures and Diagnostic Studies:   Dg Chest 2 View  Result Date: 05/15/2018 CLINICAL DATA:  Chest pain with breathing for a few days. Pain in the left chest markedly worsened today. EXAM: CHEST - 2 VIEW COMPARISON:  None. FINDINGS: The lungs are clear. No pneumothorax or pleural fluid. Heart size is normal. No bony abnormality. IMPRESSION: Negative chest. Electronically Signed   By: Nancy Patel M.D.   On: 05/15/2018 15:54   Ct Angio Chest Pe W And/or Wo Contrast  Result Date: 05/15/2018 CLINICAL DATA:  Pleuritic chest pain EXAM: CT ANGIOGRAPHY CHEST WITH CONTRAST TECHNIQUE: Multidetector CT imaging of the chest was performed using the standard protocol during bolus administration of intravenous contrast. Multiplanar CT image reconstructions and MIPs were obtained to evaluate the vascular anatomy. CONTRAST:  <See Chart> ISOVUE-370 IOPAMIDOL (ISOVUE-370) INJECTION 76% COMPARISON:  Chest x-ray today FINDINGS: Cardiovascular: Small pulmonary emboli noted within posterior basilar branches of the left pulmonary artery. No additional pulmonary emboli noted. No evidence of right heart strain. Mild cardiomegaly. Aorta is normal caliber. Mediastinum/Nodes: No mediastinal, hilar, or axillary adenopathy. Lungs/Pleura: Airspace disease noted at the left lung base could reflect atelectasis or early infarct. Trace left pleural effusion. No confluent opacity or effusion on the right. Upper Abdomen: Imaging into the upper abdomen shows no acute findings. Musculoskeletal: Chest wall soft tissues are unremarkable. No acute bony abnormality. Review of the MIP images confirms the above findings.   IMPRESSION: Small pulmonary emboli within posterior basilar branches of the left pulmonary artery. Associated trace left effusion and airspace disease at the left lung base which could reflect atelectasis or early  infarct. Cardiomegaly. Critical Value/emergent results were called by telephone at the time of interpretation on 05/15/2018 at 10:01 pm to Dr. Harolyn Patel , who verbally acknowledged these results. Electronically Signed   By: Charlett Nose M.D.   On: 05/15/2018 22:04   Vas [place] Lower Extremity Venous (dvt)  Result Date: 05/16/2018  Lower Venous Study Indications: SOB, and pulmonary embolism.  Risk Factors: Confirmed PE. Performing Technologist: Toma Deiters RVS  Examination Guidelines: A complete evaluation includes B-mode imaging, spectral Doppler, color Doppler, and power Doppler as needed of all accessible portions of each vessel. Bilateral testing is considered an integral part of a complete examination. Limited examinations for reoccurring indications may be performed as noted.  Right Venous Findings: +---------+---------------+---------+-----------+----------+--------------+          CompressibilityPhasicitySpontaneityPropertiesSummary        +---------+---------------+---------+-----------+----------+--------------+ CFV      Full           Yes      Yes                                 +---------+---------------+---------+-----------+----------+--------------+ SFJ      Full                                                        +---------+---------------+---------+-----------+----------+--------------+  FV Prox  Full           Yes      Yes                                 +---------+---------------+---------+-----------+----------+--------------+ FV Mid   Full                                                        +---------+---------------+---------+-----------+----------+--------------+ FV DistalFull           Yes      Yes                                 +---------+---------------+---------+-----------+----------+--------------+ PFV      Full           Yes      Yes                                  +---------+---------------+---------+-----------+----------+--------------+ POP      Full           Yes      Yes                                 +---------+---------------+---------+-----------+----------+--------------+ PTV      Full                                                        +---------+---------------+---------+-----------+----------+--------------+ PERO                                                  Not visualized +---------+---------------+---------+-----------+----------+--------------+  Right Technical Findings: Not visualized segments include Peroneal. Unable to visualize well enough to fully evaluate.  Left Venous Findings: +---------+---------------+---------+-----------+----------+-------+          CompressibilityPhasicitySpontaneityPropertiesSummary +---------+---------------+---------+-----------+----------+-------+ CFV      Full           Yes      Yes                          +---------+---------------+---------+-----------+----------+-------+ SFJ      Full                                                 +---------+---------------+---------+-----------+----------+-------+ FV Prox  Full           Yes      Yes                          +---------+---------------+---------+-----------+----------+-------+ FV Mid   Full                                                 +---------+---------------+---------+-----------+----------+-------+  FV DistalFull           Yes      Yes                          +---------+---------------+---------+-----------+----------+-------+ PFV      Full           Yes      Yes                          +---------+---------------+---------+-----------+----------+-------+ POP      Full           Yes      Yes                          +---------+---------------+---------+-----------+----------+-------+ PTV      Full                                                  +---------+---------------+---------+-----------+----------+-------+ PERO     Full                                                 +---------+---------------+---------+-----------+----------+-------+    Summary: Right: There is no evidence of deep vein thrombosis in the lower extremity. However, portions of this examination were limited- see technologist comments above. No cystic structure found in the popliteal fossa. Left: There is no evidence of deep vein thrombosis in the lower extremity. No cystic structure found in the popliteal fossa.  *See table(s) above for measurements and observations. Electronically signed by Gretta Began MD on 05/16/2018 at 3:09:52 PM.    Final      Labs:   Basic Metabolic Panel: Recent Labs  Lab 05/15/18 2038 05/16/18 0416  NA 140 139  K 3.2* 3.5  CL 109 113*  CO2 22 20*  GLUCOSE 108* 90  BUN 11 8  CREATININE 0.82 0.72  CALCIUM 9.2 8.4*   GFR Estimated Creatinine Clearance: 104.2 mL/min (by C-G formula based on SCr of 0.72 mg/dL). Liver Function Tests: Recent Labs  Lab 05/15/18 2038 05/16/18 0416  AST 15 14*  ALT 20 16  ALKPHOS 64 49  BILITOT 0.1* 0.2*  PROT 7.5 6.2*  ALBUMIN 3.9 3.1*   No results for input(s): LIPASE, AMYLASE in the last 168 hours. No results for input(s): AMMONIA in the last 168 hours. Coagulation profile No results for input(s): INR, PROTIME in the last 168 hours.  CBC: Recent Labs  Lab 05/15/18 2038 05/16/18 0416  WBC 14.6* 11.2*  NEUTROABS 11.3*  --   HGB 12.5 11.2*  HCT 39.3 35.3*  MCV 91.4 92.2  PLT 314 259   Cardiac Enzymes: Recent Labs  Lab 05/15/18 2225  TROPONINI <0.03   BNP: Invalid input(s): POCBNP CBG: No results for input(s): GLUCAP in the last 168 hours. D-Dimer No results for input(s): DDIMER in the last 72 hours. Hgb A1c No results for input(s): HGBA1C in the last 72 hours. Lipid Profile No results for input(s): CHOL, HDL, LDLCALC, TRIG, CHOLHDL, LDLDIRECT in the last 72  hours. Thyroid function studies No results for input(s): TSH, T4TOTAL, T3FREE, THYROIDAB in the last 72 hours.  Invalid input(s): FREET3 Anemia work up No results for input(s): VITAMINB12, FOLATE, FERRITIN, TIBC, IRON, RETICCTPCT in the last 72 hours. Microbiology No results found for this or any previous visit (from the past 240 hour(s)).   Discharge Instructions:   Discharge Instructions    Call MD for:  difficulty breathing, headache or visual disturbances   Complete by:  As directed    Call MD for:  temperature >100.4   Complete by:  As directed    Diet - low sodium heart healthy   Complete by:  As directed    Discharge instructions   Complete by:  As directed    Please follow-up with your primary care physician in 1 week.  Take precautions on blood thinners.   Increase activity slowly   Complete by:  As directed      Allergies as of 05/16/2018   No Known Allergies     Medication List    STOP taking these medications   amoxicillin-clavulanate 875-125 MG tablet Commonly known as:  AUGMENTIN     TAKE these medications   albuterol 108 (90 Base) MCG/ACT inhaler Commonly known as:  PROVENTIL HFA;VENTOLIN HFA Inhale 2 puffs into the lungs every 4 (four) hours as needed for wheezing or shortness of breath.   azithromycin 250 MG tablet Commonly known as:  ZITHROMAX Take 250-500 mg by mouth. Take as directed on package   benzonatate 100 MG capsule Commonly known as:  TESSALON Take 100 mg by mouth 3 (three) times daily.   busPIRone 30 MG tablet Commonly known as:  BUSPAR Take 15 mg by mouth 2 (two) times daily.   FORFIVO XL 450 MG Tb24 Generic drug:  buPROPion HCl ER (XL) Take 1 tablet by mouth daily.   levonorgestrel 20 MCG/24HR IUD Commonly known as:  MIRENA 1 each by Intrauterine route once.   mirtazapine 45 MG tablet Commonly known as:  REMERON Take 45 mg by mouth at bedtime.   pramipexole 0.25 MG tablet Commonly known as:  MIRAPEX Take 0.25 mg by  mouth at bedtime.   predniSONE 5 MG tablet Commonly known as:  DELTASONE Take 5 mg by mouth. Taper down as directed on package   Rivaroxaban 15 & 20 MG Tbpk Take as directed on package: Start with one 15mg  tablet by mouth twice a day with food. On Day 22, switch to one 20mg  tablet once a day with food.   topiramate 100 MG tablet Commonly known as:  TOPAMAX Take 100 mg by mouth 2 (two) times daily.   traMADol 50 MG tablet Commonly known as:  ULTRAM Take 1 tablet (50 mg total) by mouth every 6 (six) hours as needed for severe pain.   tretinoin 0.1 % cream Commonly known as:  RETIN-A Apply 1 application topically at bedtime.      Follow-up Information    Laqueta DueFurr, Nancy M., MD. Schedule an appointment as soon as possible for a visit in 1 week(s).   Specialty:  Internal Medicine Contact information: [id number] [place] [id number] [phone]           Time coordinating discharge: 39 minutes  Signed:  Traevion Poehler  [facility] 05/16/2018, 3:12 PM

## 2018-05-16 NOTE — H&P (Signed)
History and Physical   Nancy Patel ZOX:[id number] DOB: June 30, 1975 DOA: 05/15/2018  Referring MD/NP/PA: Dr. Adela Lank  PCP: Laqueta Due., MD   Patient coming from: Home  Chief Complaint: Chest pain or shortness of breath  HPI: Nancy Patel is a 43 y.o. female with medical history significant of recent appendicitis with appendectomy on January 30, bronchitis who presents to the ER with shortness of breath as well as cough.  Patient was being treated for bronchitis but not getting better.  In the ER she was found to have small pulmonary embolus.  She was evaluated and being discharged home but was noted to have abnormal EKG with the lateral leads have been T waves flipped and some T wave depression.  Patient is therefore being admitted overnight to evaluate for possible right heart strain or other cardiac abnormalities.  She denied any pain at this point.  She is not hypoxic and does not have any significant shortness of breath.  She patient has received a dose of Xarelto and appears to be stable..  ED Course: Temperature is 98 to blood pressure was 70/100 pulse 114 respiratory of 18 her oxygen sat was 98% on room air.  White count is 14.6 with potassium 3.2 and glucose 108 otherwise the rest of the CBCs and CMP were within normal.  CT angiogram of the chest shows Small pulmonary emboli within posterior basilar branches of the left pulmonary artery. Associated trace left effusion and airspace disease at the left lung base which could reflect atelectasis or early infarct.  Patient was initially given a dose of Xarelto and was being discharged home.  Due to worrisome about abnormal EKG she is being admitted to the hospital for work-up.  Review of Systems: As per HPI otherwise 10 point review of systems negative.    Past Medical History:  Diagnosis Date  . Medical history non-contributory     Past Surgical History:  Procedure Laterality Date  . FRACTURE SURGERY    . GANGLION CYST EXCISION    .  GANGLION CYST EXCISION Left 2003  . LAPAROSCOPIC APPENDECTOMY N/A 05/03/2018   Procedure: APPENDECTOMY LAPAROSCOPIC;  Surgeon: Darnell Level, MD;  Location: [facility];  Service: General;  Laterality: N/A;  . WRIST FRACTURE SURGERY Right 2010     reports that she has never smoked. She has never used smokeless tobacco. She reports current alcohol use of about 2.0 standard drinks of alcohol per week. She reports that she does not use drugs.  No Known Allergies  No family history on file.   Prior to Admission medications   Medication Sig Start Date End Date Taking? Authorizing Provider  albuterol (PROVENTIL HFA;VENTOLIN HFA) 108 (90 Base) MCG/ACT inhaler Inhale 2 puffs into the lungs every 4 (four) hours as needed for wheezing or shortness of breath. 05/13/18 06/12/18 Yes [provider]  azithromycin (ZITHROMAX) 250 MG tablet Take 250-500 mg by mouth. Take as directed on package 05/13/18  Yes [provider]  benzonatate (TESSALON) 100 MG capsule Take 100 mg by mouth 3 (three) times daily. 05/13/18 05/23/18 Yes [provider]  busPIRone (BUSPAR) 30 MG tablet Take 15 mg by mouth 2 (two) times daily.  04/15/18  Yes [provider]  FORFIVO XL 450 MG TB24 Take 1 tablet by mouth daily.  04/24/18  Yes [provider]  levonorgestrel (MIRENA) 20 MCG/24HR IUD 1 each by Intrauterine route once.   Yes [provider]  mirtazapine (REMERON) 45 MG tablet Take 45 mg by mouth at bedtime.  04/23/18  Yes [provider]  pramipexole (MIRAPEX) 0.25 MG tablet Take 0.25 mg by mouth at bedtime.  01/16/18  Yes [provider]  predniSONE (DELTASONE) 5 MG tablet Take 5 mg by mouth. Taper down as directed on package 05/13/18 05/19/18 Yes [provider]  topiramate (TOPAMAX) 100 MG tablet Take 100 mg by mouth 2 (two) times daily.  02/18/18  Yes [provider]  traMADol (ULTRAM) 50 MG tablet Take 1 tablet (50 mg total) by mouth every 6 (six)  hours as needed for severe pain. 05/04/18  Yes Meuth, Brooke A, PA-C  tretinoin (RETIN-A) 0.1 % cream Apply 1 application topically at bedtime.   Yes [provider]  Rivaroxaban 15 & 20 MG TBPK Take as directed on package: Start with one 15mg  tablet by mouth twice a day with food. On Day 22, switch to one 20mg  tablet once a day with food. 05/15/18   Anselm PancoastJoy, Shawn C, PA-C    Physical Exam: Vitals:   05/15/18 2100 05/15/18 2130 05/15/18 2330 05/15/18 2358  BP: (!) 146/81 128/75 134/86 127/87  Pulse: 97 90 95 97  Resp: 16 18 14 17   Temp:      TempSrc:      SpO2: 100% 100% 100% 100%  Weight:      Height:          Constitutional: NAD, calm, comfortable Vitals:   05/15/18 2100 05/15/18 2130 05/15/18 2330 05/15/18 2358  BP: (!) 146/81 128/75 134/86 127/87  Pulse: 97 90 95 97  Resp: 16 18 14 17   Temp:      TempSrc:      SpO2: 100% 100% 100% 100%  Weight:      Height:       Eyes: PERRL, lids and conjunctivae normal ENMT: Mucous membranes are moist. Posterior pharynx clear of any exudate or lesions.Normal dentition.  Neck: normal, supple, no masses, no thyromegaly Respiratory: clear to auscultation bilaterally, no wheezing, no crackles. Normal respiratory effort. No accessory muscle use.  Cardiovascular: Sinus tachycardia, no murmurs / rubs / gallops. No extremity edema. 2+ pedal pulses. No carotid bruits.  Abdomen: no tenderness, no masses palpated. No hepatosplenomegaly. Bowel sounds positive.  Musculoskeletal: no clubbing / cyanosis. No joint deformity upper and lower extremities. Good ROM, no contractures. Normal muscle tone.  Skin: no rashes, lesions, ulcers. No induration Neurologic: CN 2-12 grossly intact. Sensation intact, DTR normal. Strength 5/5 in all 4.  Psychiatric: Normal judgment and insight. Alert and oriented x 3. Normal mood.     Labs on Admission: I have personally reviewed following labs and imaging studies  CBC: Recent Labs  Lab 05/15/18 2038  WBC  14.6*  NEUTROABS 11.3*  HGB 12.5  HCT 39.3  MCV 91.4  PLT 314   Basic Metabolic Panel: Recent Labs  Lab 05/15/18 2038  NA 140  K 3.2*  CL 109  CO2 22  GLUCOSE 108*  BUN 11  CREATININE 0.82  CALCIUM 9.2   GFR: Estimated Creatinine Clearance: 101.7 mL/min (by C-G formula based on SCr of 0.82 mg/dL). Liver Function Tests: Recent Labs  Lab 05/15/18 2038  AST 15  ALT 20  ALKPHOS 64  BILITOT 0.1*  PROT 7.5  ALBUMIN 3.9   No results for input(s): LIPASE, AMYLASE in the last 168 hours. No results for input(s): AMMONIA in the last 168 hours. Coagulation Profile: No results for input(s): INR, PROTIME in the last 168 hours. Cardiac Enzymes: Recent Labs  Lab 05/15/18 2225  TROPONINI <0.03  BNP (last 3 results) No results for input(s): PROBNP in the last 8760 hours. HbA1C: No results for input(s): HGBA1C in the last 72 hours. CBG: No results for input(s): GLUCAP in the last 168 hours. Lipid Profile: No results for input(s): CHOL, HDL, LDLCALC, TRIG, CHOLHDL, LDLDIRECT in the last 72 hours. Thyroid Function Tests: No results for input(s): TSH, T4TOTAL, FREET4, T3FREE, THYROIDAB in the last 72 hours. Anemia Panel: No results for input(s): VITAMINB12, FOLATE, FERRITIN, TIBC, IRON, RETICCTPCT in the last 72 hours. Urine analysis:    Component Value Date/Time   COLORURINE YELLOW 05/15/2018 2038   APPEARANCEUR CLOUDY (A) 05/15/2018 2038   LABSPEC 1.014 05/15/2018 2038   PHURINE 6.0 05/15/2018 2038   GLUCOSEU NEGATIVE 05/15/2018 2038   HGBUR NEGATIVE 05/15/2018 2038   BILIRUBINUR NEGATIVE 05/15/2018 2038   KETONESUR NEGATIVE 05/15/2018 2038   PROTEINUR NEGATIVE 05/15/2018 2038   NITRITE NEGATIVE 05/15/2018 2038   LEUKOCYTESUR SMALL (A) 05/15/2018 2038   Sepsis Labs: @LABRCNTIP (procalcitonin:4,lacticidven:4) )No results found for this or any previous visit (from the past 240 hour(s)).   Radiological Exams on Admission: Dg Chest 2 View  Result Date:  05/15/2018 CLINICAL DATA:  Chest pain with breathing for a few days. Pain in the left chest markedly worsened today. EXAM: CHEST - 2 VIEW COMPARISON:  None. FINDINGS: The lungs are clear. No pneumothorax or pleural fluid. Heart size is normal. No bony abnormality. IMPRESSION: Negative chest. Electronically Signed   By: Drusilla Kannerhomas  Dalessio M.D.   On: 05/15/2018 15:54   Ct Angio Chest Pe W And/or Wo Contrast  Result Date: 05/15/2018 CLINICAL DATA:  Pleuritic chest pain EXAM: CT ANGIOGRAPHY CHEST WITH CONTRAST TECHNIQUE: Multidetector CT imaging of the chest was performed using the standard protocol during bolus administration of intravenous contrast. Multiplanar CT image reconstructions and MIPs were obtained to evaluate the vascular anatomy. CONTRAST:  <See Chart> ISOVUE-370 IOPAMIDOL (ISOVUE-370) INJECTION 76% COMPARISON:  Chest x-ray today FINDINGS: Cardiovascular: Small pulmonary emboli noted within posterior basilar branches of the left pulmonary artery. No additional pulmonary emboli noted. No evidence of right heart strain. Mild cardiomegaly. Aorta is normal caliber. Mediastinum/Nodes: No mediastinal, hilar, or axillary adenopathy. Lungs/Pleura: Airspace disease noted at the left lung base could reflect atelectasis or early infarct. Trace left pleural effusion. No confluent opacity or effusion on the right. Upper Abdomen: Imaging into the upper abdomen shows no acute findings. Musculoskeletal: Chest wall soft tissues are unremarkable. No acute bony abnormality. Review of the MIP images confirms the above findings. IMPRESSION: Small pulmonary emboli within posterior basilar branches of the left pulmonary artery. Associated trace left effusion and airspace disease at the left lung base which could reflect atelectasis or early infarct. Cardiomegaly. Critical Value/emergent results were called by telephone at the time of interpretation on 05/15/2018 at 10:01 pm to Dr. Harolyn RutherfordSHAWN JOY , who verbally acknowledged these  results. Electronically Signed   By: Charlett NoseKevin  Dover M.D.   On: 05/15/2018 22:04    EKG: Independently reviewed.  It shows normal sinus rhythm with lateral and inferior leads T waves inversion ST depression.  Assessment/Plan Principal Problem:   Pulmonary emboli (HCC) Active Problems:   Hypokalemia   EKG abnormalities   Leucocytosis     #1 pulmonary emboli: Risk factors for patient's PE is probably recent surgery.  She has an IUD which is hormone base and could be a contributory risk factor.  No family history of thromboembolic phenomena.  Patient will be admitted and started on heparin drip.  Echocardiogram in the morning.  If no significant abnormalities she will resume Xarelto this ordered today.  We will continue follow-up at home home with PCP.  #2 hypokalemia: Replete potassium.  #3 EKG abnormalities: Patient again is going to get echocardiogram.  This could be her normal variant.  It could also reflect some strain on her heart muscles from the PE.  The PE appears small however on the CTA.  #4 leukocytosis: Probably due to recent steroids.  Patient was on prednisone with albuterol for bronchitis.  Monitor white count closely.   DVT prophylaxis: Heparin drip Code Status: Full code Family Communication: No family at bedside Disposition Plan: Home Consults called: None Admission status: Observation  Severity of Illness: The appropriate patient status for this patient is OBSERVATION. Observation status is judged to be reasonable and necessary in order to provide the required intensity of service to ensure the patient's safety. The patient's presenting symptoms, physical exam findings, and initial radiographic and laboratory data in the context of their medical condition is felt to place them at decreased risk for further clinical deterioration. Furthermore, it is anticipated that the patient will be medically stable for discharge from the hospital within 2 midnights of admission. The  following factors support the patient status of observation.   " The patient's presenting symptoms include shortness of breath and chest pain. " The physical exam findings include sinus tachycardia. " The initial radiographic and laboratory data are CT angiogram showing PE and EKG this abnormal.     Tamim Skog,LAWAL MD [facility] Pager [phone]  If 7PM-7AM, please contact night-coverage [email or web address] Password [facility]  05/16/2018, 12:37 AM

## 2018-08-01 DIAGNOSIS — Z7901 Long term (current) use of anticoagulants: Secondary | ICD-10-CM | POA: Insufficient documentation

## 2019-01-17 DIAGNOSIS — Z86711 Personal history of pulmonary embolism: Secondary | ICD-10-CM | POA: Insufficient documentation

## 2019-07-12 DIAGNOSIS — Z8269 Family history of other diseases of the musculoskeletal system and connective tissue: Secondary | ICD-10-CM | POA: Insufficient documentation

## 2019-09-06 DIAGNOSIS — M545 Low back pain: Secondary | ICD-10-CM | POA: Diagnosis not present

## 2019-09-12 DIAGNOSIS — D171 Benign lipomatous neoplasm of skin and subcutaneous tissue of trunk: Secondary | ICD-10-CM | POA: Diagnosis not present

## 2019-09-12 DIAGNOSIS — L578 Other skin changes due to chronic exposure to nonionizing radiation: Secondary | ICD-10-CM | POA: Diagnosis not present

## 2019-09-24 DIAGNOSIS — F4323 Adjustment disorder with mixed anxiety and depressed mood: Secondary | ICD-10-CM | POA: Diagnosis not present

## 2019-10-08 DIAGNOSIS — F4323 Adjustment disorder with mixed anxiety and depressed mood: Secondary | ICD-10-CM | POA: Diagnosis not present

## 2019-10-22 DIAGNOSIS — F4323 Adjustment disorder with mixed anxiety and depressed mood: Secondary | ICD-10-CM | POA: Diagnosis not present

## 2019-10-25 DIAGNOSIS — G4709 Other insomnia: Secondary | ICD-10-CM | POA: Diagnosis not present

## 2019-10-25 DIAGNOSIS — Z975 Presence of (intrauterine) contraceptive device: Secondary | ICD-10-CM | POA: Insufficient documentation

## 2019-10-25 DIAGNOSIS — M533 Sacrococcygeal disorders, not elsewhere classified: Secondary | ICD-10-CM | POA: Diagnosis not present

## 2019-10-25 DIAGNOSIS — N938 Other specified abnormal uterine and vaginal bleeding: Secondary | ICD-10-CM | POA: Diagnosis not present

## 2019-11-12 DIAGNOSIS — F341 Dysthymic disorder: Secondary | ICD-10-CM | POA: Diagnosis not present

## 2019-11-12 DIAGNOSIS — F5081 Binge eating disorder: Secondary | ICD-10-CM | POA: Diagnosis not present

## 2019-11-15 DIAGNOSIS — M545 Low back pain: Secondary | ICD-10-CM | POA: Diagnosis not present

## 2019-11-22 DIAGNOSIS — M545 Low back pain: Secondary | ICD-10-CM | POA: Diagnosis not present

## 2019-11-29 DIAGNOSIS — M545 Low back pain: Secondary | ICD-10-CM | POA: Diagnosis not present

## 2019-12-07 DIAGNOSIS — Z20822 Contact with and (suspected) exposure to covid-19: Secondary | ICD-10-CM | POA: Diagnosis not present

## 2019-12-07 DIAGNOSIS — R5383 Other fatigue: Secondary | ICD-10-CM | POA: Diagnosis not present

## 2019-12-07 DIAGNOSIS — J029 Acute pharyngitis, unspecified: Secondary | ICD-10-CM | POA: Diagnosis not present

## 2019-12-07 DIAGNOSIS — R197 Diarrhea, unspecified: Secondary | ICD-10-CM | POA: Diagnosis not present

## 2019-12-12 DIAGNOSIS — F4323 Adjustment disorder with mixed anxiety and depressed mood: Secondary | ICD-10-CM | POA: Diagnosis not present

## 2019-12-17 DIAGNOSIS — F5081 Binge eating disorder: Secondary | ICD-10-CM | POA: Diagnosis not present

## 2019-12-17 DIAGNOSIS — F411 Generalized anxiety disorder: Secondary | ICD-10-CM | POA: Diagnosis not present

## 2019-12-17 DIAGNOSIS — F341 Dysthymic disorder: Secondary | ICD-10-CM | POA: Diagnosis not present

## 2019-12-24 DIAGNOSIS — F4323 Adjustment disorder with mixed anxiety and depressed mood: Secondary | ICD-10-CM | POA: Diagnosis not present

## 2020-01-07 DIAGNOSIS — D239 Other benign neoplasm of skin, unspecified: Secondary | ICD-10-CM | POA: Diagnosis not present

## 2020-01-07 DIAGNOSIS — D2239 Melanocytic nevi of other parts of face: Secondary | ICD-10-CM | POA: Diagnosis not present

## 2020-01-07 DIAGNOSIS — L578 Other skin changes due to chronic exposure to nonionizing radiation: Secondary | ICD-10-CM | POA: Diagnosis not present

## 2020-01-07 DIAGNOSIS — L858 Other specified epidermal thickening: Secondary | ICD-10-CM | POA: Diagnosis not present

## 2020-01-09 DIAGNOSIS — F4323 Adjustment disorder with mixed anxiety and depressed mood: Secondary | ICD-10-CM | POA: Diagnosis not present

## 2020-01-22 DIAGNOSIS — F4323 Adjustment disorder with mixed anxiety and depressed mood: Secondary | ICD-10-CM | POA: Diagnosis not present

## 2020-01-29 DIAGNOSIS — F4323 Adjustment disorder with mixed anxiety and depressed mood: Secondary | ICD-10-CM | POA: Diagnosis not present

## 2020-02-04 DIAGNOSIS — F4323 Adjustment disorder with mixed anxiety and depressed mood: Secondary | ICD-10-CM | POA: Diagnosis not present

## 2020-02-06 DIAGNOSIS — M792 Neuralgia and neuritis, unspecified: Secondary | ICD-10-CM | POA: Diagnosis not present

## 2020-02-06 DIAGNOSIS — M2012 Hallux valgus (acquired), left foot: Secondary | ICD-10-CM | POA: Diagnosis not present

## 2020-02-06 DIAGNOSIS — M545 Low back pain, unspecified: Secondary | ICD-10-CM | POA: Diagnosis not present

## 2020-02-06 DIAGNOSIS — M2011 Hallux valgus (acquired), right foot: Secondary | ICD-10-CM | POA: Diagnosis not present

## 2020-02-13 ENCOUNTER — Emergency Department (HOSPITAL_COMMUNITY): Payer: BC Managed Care – PPO

## 2020-02-13 ENCOUNTER — Emergency Department (HOSPITAL_COMMUNITY)
Admission: EM | Admit: 2020-02-13 | Discharge: 2020-02-14 | Disposition: A | Discharge status: Home / Self Care | Payer: BC Managed Care – PPO | Attending: Emergency Medicine | Admitting: Emergency Medicine

## 2020-02-13 ENCOUNTER — Other Ambulatory Visit: Payer: Self-pay

## 2020-02-13 ENCOUNTER — Encounter (HOSPITAL_COMMUNITY): Payer: Self-pay

## 2020-02-13 DIAGNOSIS — R002 Palpitations: Secondary | ICD-10-CM | POA: Insufficient documentation

## 2020-02-13 DIAGNOSIS — I10 Essential (primary) hypertension: Secondary | ICD-10-CM | POA: Diagnosis not present

## 2020-02-13 DIAGNOSIS — R000 Tachycardia, unspecified: Secondary | ICD-10-CM | POA: Insufficient documentation

## 2020-02-13 DIAGNOSIS — Z20822 Contact with and (suspected) exposure to covid-19: Secondary | ICD-10-CM | POA: Insufficient documentation

## 2020-02-13 DIAGNOSIS — R0789 Other chest pain: Secondary | ICD-10-CM | POA: Diagnosis not present

## 2020-02-13 DIAGNOSIS — R0602 Shortness of breath: Secondary | ICD-10-CM | POA: Diagnosis not present

## 2020-02-13 DIAGNOSIS — Z86711 Personal history of pulmonary embolism: Secondary | ICD-10-CM | POA: Diagnosis not present

## 2020-02-13 DIAGNOSIS — R079 Chest pain, unspecified: Secondary | ICD-10-CM | POA: Diagnosis not present

## 2020-02-13 LAB — BASIC METABOLIC PANEL
Anion gap: 9 (ref 5–15)
BUN: 8 mg/dL (ref 6–20)
CO2: 23 mmol/L (ref 22–32)
Calcium: 9 mg/dL (ref 8.9–10.3)
Chloride: 104 mmol/L (ref 98–111)
Creatinine, Ser: 0.75 mg/dL (ref 0.44–1.00)
GFR, Estimated: >60 mL/min (ref >60)
Glucose, Bld: 106 mg/dL — ABNORMAL HIGH (ref 70–99)
Potassium: 3.9 mmol/L (ref 3.5–5.1)
Sodium: 136 mmol/L (ref 135–145)

## 2020-02-13 LAB — TROPONIN I (HIGH SENSITIVITY): Troponin I (High Sensitivity): <2 ng/L (ref <18)

## 2020-02-13 LAB — CBC
HCT: 35.6 % — ABNORMAL LOW (ref 36.0–46.0)
Hemoglobin: 11.2 g/dL — ABNORMAL LOW (ref 12.0–15.0)
MCH: 26.2 pg (ref 26.0–34.0)
MCHC: 31.5 g/dL (ref 30.0–36.0)
MCV: 83.2 fL (ref 80.0–100.0)
Platelets: 315 10*3/uL (ref 150–400)
RBC: 4.28 MIL/uL (ref 3.87–5.11)
RDW: 13.8 % (ref 11.5–15.5)
WBC: 11.4 10*3/uL — ABNORMAL HIGH (ref 4.0–10.5)
nRBC: 0 % (ref 0.0–0.2)

## 2020-02-13 LAB — I-STAT BETA HCG BLOOD, ED (NOT ORDERABLE): I-stat hCG, quantitative: <5 m[IU]/mL (ref <5)

## 2020-02-13 LAB — D-DIMER, QUANTITATIVE: D-Dimer, Quant: 0.52 ug/mL-FEU — ABNORMAL HIGH (ref 0.00–0.50)

## 2020-02-13 MED ORDER — IOHEXOL 350 MG/ML SOLN
100.0000 mL | Freq: Once | INTRAVENOUS | Status: AC | PRN
  Administered 2020-02-13: 100 mL via INTRAVENOUS

## 2020-02-13 NOTE — Discharge Instructions (Addendum)
Fortunately your chest CT scan did not show any evidence of blood clot in your lung or lung infection.  Call and follow up closely with your doctor for further evaluation.  Return if you have any concerns.   Your blood work revealed no evidence of heart damage.

## 2020-02-13 NOTE — ED Triage Notes (Signed)
Pt reports feeling chest tightness and SHOB while ambulating. Pt reports going to UC and was informed that her d-dimer was elevated. Pt reports having a PE a few years ago. Pt denies any blood thinners.

## 2020-02-13 NOTE — ED Provider Notes (Signed)
[facility]-EMERGENCY DEPT Provider Note   CSN: [id number] Arrival date & time: 02/13/20  2030     History Chief Complaint  Patient presents with  . Chest Pain  . Abnormal Lab    Nancy Patel is a 44 y.o. female.  The history is provided by the patient and medical records. No language interpreter was used.     44 year old female prior history of PE presenting for evaluation of shortness of breath.  Patient report for the past 3 days she have not been feeling well.  She felt some mild discomfort in her chest as well as having sensation of heart racing and increased shortness of breath.  Symptoms worse with exertion but present at rest as well.  Occasional nonproductive cough.  No associated fever chills no exertional chest pain no nausea no vomiting no runny nose sneezing or sore throat or loss of taste or smell.  She has been fully vaccinated for COVID-19.  She felt symptoms somewhat remind her of her previous PE that she was diagnosed in 2020 after she had an appendicitis and required surgery.  Symptoms started 2 weeks after her surgeries.  However she also report additional work-up of her PE demonstrate that she has factor V Leiden.  She was on Xarelto for approximately 6 months but have transitioned over to aspirin.  She denies any recent travel, calf pain or leg swelling.  No hemoptysis.  Past Medical History:  Diagnosis Date  . Medical history non-contributory     Patient Active Problem List   Diagnosis Date Noted  . Hypokalemia 05/16/2018  . Pulmonary emboli (HCC) 05/16/2018  . EKG abnormalities 05/16/2018  . Leucocytosis 05/16/2018  . Appendicitis, acute 05/03/2018  . Acute appendicitis 05/03/2018    Past Surgical History:  Procedure Laterality Date  . FRACTURE SURGERY    . GANGLION CYST EXCISION    . GANGLION CYST EXCISION Left 2003  . LAPAROSCOPIC APPENDECTOMY N/A 05/03/2018   Procedure: APPENDECTOMY LAPAROSCOPIC;  Surgeon: Darnell Level, MD;   Location: [facility];  Service: General;  Laterality: N/A;  . WRIST FRACTURE SURGERY Right 2010     OB History   No obstetric history on file.     History reviewed. No pertinent family history.  Social History   Tobacco Use  . Smoking status: Never Smoker  . Smokeless tobacco: Never Used  Vaping Use  . Vaping Use: Never used  Substance Use Topics  . Alcohol use: Yes    Alcohol/week: 2.0 standard drinks    Types: 2 Glasses of wine per week    Comment: per week  . Drug use: Never    Home Medications Prior to Admission medications   Medication Sig Start Date End Date Taking? Authorizing Provider  albuterol (PROVENTIL HFA;VENTOLIN HFA) 108 (90 Base) MCG/ACT inhaler Inhale 2 puffs into the lungs every 4 (four) hours as needed for wheezing or shortness of breath. 05/13/18 06/12/18  [provider]  azithromycin (ZITHROMAX) 250 MG tablet Take 250-500 mg by mouth. Take as directed on package 05/13/18   [provider]  busPIRone (BUSPAR) 30 MG tablet Take 15 mg by mouth 2 (two) times daily.  04/15/18   [provider]  FORFIVO XL 450 MG TB24 Take 1 tablet by mouth daily.  04/24/18   [provider]  levonorgestrel (MIRENA) 20 MCG/24HR IUD 1 each by Intrauterine route once.    [provider]  mirtazapine (REMERON) 45 MG tablet Take 45 mg by mouth at bedtime.  04/23/18  [provider]  pramipexole (MIRAPEX) 0.25 MG tablet Take 0.25 mg by mouth at bedtime.  01/16/18   [provider]  Rivaroxaban 15 & 20 MG TBPK Take as directed on package: Start with one 15mg  tablet by mouth twice a day with food. On Day 22, switch to one 20mg  tablet once a day with food. 05/15/18   Joy, Shawn C, PA-C  topiramate (TOPAMAX) 100 MG tablet Take 100 mg by mouth 2 (two) times daily.  02/18/18   [provider]  traMADol (ULTRAM) 50 MG tablet Take 1 tablet (50 mg total) by mouth every 6 (six) hours as needed for severe pain. 05/04/18   Meuth, Brooke  A, PA-C  tretinoin (RETIN-A) 0.1 % cream Apply 1 application topically at bedtime.    [provider]    Allergies    Patient has no known allergies.  Review of Systems   Review of Systems  Physical Exam Updated Vital Signs BP (!) 160/95 (BP Location: Right Arm)   Pulse (!) 109   Temp 98.1 F (36.7 C) (Oral)   Resp 16   Ht 5\' 8"  (1.727 m)   Wt 88.5 kg   SpO2 98%   BMI 29.65 kg/m   Physical Exam Vitals and nursing note reviewed.  Constitutional:      General: She is not in acute distress.    Appearance: She is well-developed. She is obese.  HENT:     Head: Atraumatic.  Eyes:     Conjunctiva/sclera: Conjunctivae normal.  Cardiovascular:     Rate and Rhythm: Tachycardia present.     Pulses: Normal pulses.     Heart sounds: Normal heart sounds.  Pulmonary:     Effort: Pulmonary effort is normal.     Breath sounds: Normal breath sounds. No wheezing, rhonchi or rales.  Abdominal:     Palpations: Abdomen is soft.     Tenderness: There is no abdominal tenderness.  Musculoskeletal:     Cervical back: Neck supple.     Right lower leg: No edema.     Left lower leg: No edema.  Skin:    Findings: No rash.  Neurological:     Mental Status: She is alert. Mental status is at baseline.  Psychiatric:        Mood and Affect: Mood normal.     ED Results / Procedures / Treatments   Labs (all labs ordered are listed, but only abnormal results are displayed) Labs Reviewed  BASIC METABOLIC PANEL - Abnormal; Notable for the following components:      Result Value   Glucose, Bld 106 (*)    All other components within normal limits  CBC - Abnormal; Notable for the following components:   WBC 11.4 (*)    Hemoglobin 11.2 (*)    HCT 35.6 (*)    All other components within normal limits  D-DIMER, QUANTITATIVE (NOT AT [facility]) - Abnormal; Notable for the following components:   D-Dimer, Quant 0.52 (*)    All other components within normal limits  RESPIRATORY PANEL BY RT  PCR (FLU A&B, COVID)  TSH  I-STAT BETA HCG BLOOD, ED ([facility], [facility], [facility] ONLY)  I-STAT BETA HCG BLOOD, ED (NOT ORDERABLE)  TROPONIN I (HIGH SENSITIVITY)  TROPONIN I (HIGH SENSITIVITY)    EKG None  ED ECG REPORT   Date: 02/13/2020  Rate: 112  Rhythm: sinus tachycardia  QRS Axis: normal  Intervals: normal  ST/T Wave abnormalities: normal  Conduction Disutrbances:none  Narrative Interpretation:  Old EKG Reviewed: changes noted, tachycardic  I have personally reviewed the EKG tracing and agree with the computerized printout as noted.   Radiology DG Chest 2 View  Result Date: 02/13/2020 CLINICAL DATA:  Chest pain EXAM: CHEST - 2 VIEW COMPARISON:  05/15/2018 FINDINGS: The heart size and mediastinal contours are within normal limits. Both lungs are clear. The visualized skeletal structures are unremarkable. IMPRESSION: No acute abnormality of the lungs. Electronically Signed   By: Lauralyn Primes M.D.   On: 02/13/2020 21:07    Procedures Procedures (including critical care time)  Medications Ordered in ED Medications - No data to display  ED Course  I have reviewed the triage vital signs and the nursing notes.  Pertinent labs & imaging results that were available during my care of the patient were reviewed by me and considered in my medical decision making (see chart for details).    MDM Rules/Calculators/A&P                          BP (!) 145/94   Pulse 95   Temp 98.1 F (36.7 C) (Oral)   Resp 19   Ht 5\' 8"  (1.727 m)   Wt 88.5 kg   SpO2 97%   BMI 29.65 kg/m   Final Clinical Impression(s) / ED Diagnoses Final diagnoses:  Shortness of breath  Palpitations    Rx / DC Orders ED Discharge Orders    None     10:05 PM Patient with history of factor V Leiden, prior PE, presenting complaining of some shortness of breath and heart palpitation that felt similar to prior PE.  No recent provocative factor.  She is currently not on any DOAC.  She is tachycardic with heart  rate of 109 however she is not hypoxic.Initial chest x-ray unremarkable.  She has been fully vaccinated for COVID-19.She does have a mildly elevated D-dimer of 0.52.  Will obtain chest CT angiogram to rule out PE.  Low suspicion for ACS at this time.  11:16 PM Fortunately chest CT angiogram showed no evidence of PE and no other concerning finding.  Patient made aware of findings.  Will obtain delta troponin but anticipate discharge home with outpatient follow-up.  Patient sign out to oncoming provider who will f/u on delta trop. COVID test sent, as well as TSH.  Pt can have her PCP f/u on those results.    , PA-C 02/13/20 13/11/21    Janetta Hora, MD 02/26/20 [phone]

## 2020-02-14 LAB — RESPIRATORY PANEL BY RT PCR (FLU A&B, COVID)
Influenza A by PCR: NEGATIVE
Influenza B by PCR: NEGATIVE
SARS Coronavirus 2 by RT PCR: NEGATIVE

## 2020-02-14 LAB — TROPONIN I (HIGH SENSITIVITY): Troponin I (High Sensitivity): <2 ng/L (ref <18)

## 2020-02-14 LAB — TSH: TSH: 1.796 u[IU]/mL (ref 0.350–4.500)

## 2020-02-18 DIAGNOSIS — F4323 Adjustment disorder with mixed anxiety and depressed mood: Secondary | ICD-10-CM | POA: Diagnosis not present

## 2020-02-25 ENCOUNTER — Encounter (HOSPITAL_COMMUNITY): Payer: Self-pay | Admitting: Emergency Medicine

## 2020-02-25 ENCOUNTER — Emergency Department (HOSPITAL_COMMUNITY): Payer: BC Managed Care – PPO

## 2020-02-25 ENCOUNTER — Emergency Department (HOSPITAL_COMMUNITY)
Admission: EM | Admit: 2020-02-25 | Discharge: 2020-02-26 | Disposition: A | Discharge status: Home / Self Care | Payer: BC Managed Care – PPO | Attending: Emergency Medicine | Admitting: Emergency Medicine

## 2020-02-25 ENCOUNTER — Other Ambulatory Visit: Payer: Self-pay

## 2020-02-25 DIAGNOSIS — F419 Anxiety disorder, unspecified: Secondary | ICD-10-CM | POA: Diagnosis not present

## 2020-02-25 DIAGNOSIS — Z7901 Long term (current) use of anticoagulants: Secondary | ICD-10-CM | POA: Insufficient documentation

## 2020-02-25 DIAGNOSIS — D649 Anemia, unspecified: Secondary | ICD-10-CM | POA: Diagnosis not present

## 2020-02-25 DIAGNOSIS — R000 Tachycardia, unspecified: Secondary | ICD-10-CM | POA: Insufficient documentation

## 2020-02-25 DIAGNOSIS — R072 Precordial pain: Secondary | ICD-10-CM | POA: Diagnosis not present

## 2020-02-25 DIAGNOSIS — R079 Chest pain, unspecified: Secondary | ICD-10-CM | POA: Diagnosis not present

## 2020-02-25 DIAGNOSIS — R0602 Shortness of breath: Secondary | ICD-10-CM | POA: Diagnosis not present

## 2020-02-25 DIAGNOSIS — R059 Cough, unspecified: Secondary | ICD-10-CM | POA: Diagnosis not present

## 2020-02-25 DIAGNOSIS — E669 Obesity, unspecified: Secondary | ICD-10-CM | POA: Diagnosis not present

## 2020-02-25 DIAGNOSIS — Z7982 Long term (current) use of aspirin: Secondary | ICD-10-CM | POA: Insufficient documentation

## 2020-02-25 DIAGNOSIS — R7989 Other specified abnormal findings of blood chemistry: Secondary | ICD-10-CM | POA: Diagnosis not present

## 2020-02-25 DIAGNOSIS — R0789 Other chest pain: Secondary | ICD-10-CM | POA: Diagnosis not present

## 2020-02-25 DIAGNOSIS — D729 Disorder of white blood cells, unspecified: Secondary | ICD-10-CM | POA: Diagnosis not present

## 2020-02-25 DIAGNOSIS — D6851 Activated protein C resistance: Secondary | ICD-10-CM | POA: Diagnosis not present

## 2020-02-25 DIAGNOSIS — Z86711 Personal history of pulmonary embolism: Secondary | ICD-10-CM | POA: Diagnosis not present

## 2020-02-25 DIAGNOSIS — F3341 Major depressive disorder, recurrent, in partial remission: Secondary | ICD-10-CM | POA: Diagnosis not present

## 2020-02-25 DIAGNOSIS — R002 Palpitations: Secondary | ICD-10-CM | POA: Diagnosis not present

## 2020-02-25 DIAGNOSIS — Z6830 Body mass index (BMI) 30.0-30.9, adult: Secondary | ICD-10-CM | POA: Diagnosis not present

## 2020-02-25 LAB — CBC
HCT: 37.1 % (ref 36.0–46.0)
Hemoglobin: 11.7 g/dL — ABNORMAL LOW (ref 12.0–15.0)
MCH: 26.1 pg (ref 26.0–34.0)
MCHC: 31.5 g/dL (ref 30.0–36.0)
MCV: 82.8 fL (ref 80.0–100.0)
Platelets: 316 10*3/uL (ref 150–400)
RBC: 4.48 MIL/uL (ref 3.87–5.11)
RDW: 14.2 % (ref 11.5–15.5)
WBC: 10.5 10*3/uL (ref 4.0–10.5)
nRBC: 0 % (ref 0.0–0.2)

## 2020-02-25 LAB — BASIC METABOLIC PANEL
Anion gap: 8 (ref 5–15)
BUN: 9 mg/dL (ref 6–20)
CO2: 24 mmol/L (ref 22–32)
Calcium: 8.8 mg/dL — ABNORMAL LOW (ref 8.9–10.3)
Chloride: 108 mmol/L (ref 98–111)
Creatinine, Ser: 0.73 mg/dL (ref 0.44–1.00)
GFR, Estimated: >60 mL/min (ref >60)
Glucose, Bld: 93 mg/dL (ref 70–99)
Potassium: 3.2 mmol/L — ABNORMAL LOW (ref 3.5–5.1)
Sodium: 140 mmol/L (ref 135–145)

## 2020-02-25 LAB — I-STAT BETA HCG BLOOD, ED (MC, WL, AP ONLY): I-stat hCG, quantitative: <5 m[IU]/mL (ref <5)

## 2020-02-25 LAB — TROPONIN I (HIGH SENSITIVITY): Troponin I (High Sensitivity): <2 ng/L (ref <18)

## 2020-02-25 MED ORDER — ALUM & MAG HYDROXIDE-SIMETH 200-200-20 MG/5ML PO SUSP
30.0000 mL | Freq: Once | ORAL | Status: AC
  Administered 2020-02-25: 30 mL via ORAL
  Filled 2020-02-25: qty 30

## 2020-02-25 MED ORDER — FAMOTIDINE IN NACL 20-0.9 MG/50ML-% IV SOLN
20.0000 mg | Freq: Once | INTRAVENOUS | Status: AC
  Administered 2020-02-25: 20 mg via INTRAVENOUS
  Filled 2020-02-25: qty 50

## 2020-02-25 MED ORDER — PANTOPRAZOLE SODIUM 20 MG PO TBEC: 20.0000 mg | DELAYED_RELEASE_TABLET | Freq: Every day | ORAL | 0 refills | Status: DC

## 2020-02-25 MED ORDER — LIDOCAINE VISCOUS HCL 2 % MT SOLN
15.0000 mL | Freq: Once | OROMUCOSAL | Status: AC
  Administered 2020-02-25: 15 mL via ORAL
  Filled 2020-02-25: qty 15

## 2020-02-25 NOTE — Discharge Instructions (Signed)
You were seen in the emergency department for chest burning and tightness.  You had lab work chest x-ray EKG that did not show an obvious cause of your symptoms.  Please restart your blood thinning medication that your primary care doctor prescribed for you.  Follow-up with your PCP and your cardiologist.  Return to the emergency department if any worsening or concerning symptoms.

## 2020-02-25 NOTE — ED Provider Notes (Signed)
[facility]-EMERGENCY DEPT Provider Note   CSN: [id number] Arrival date & time: 02/25/20  2053     History Chief Complaint  Patient presents with  . Chest Pain    Nancy Patel is a 44 y.o. female.  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  She is currently not on anticoagulation.  She said she has not felt well for a few weeks with chest tightness burning in her chest and some shortness of breath with exertion.  She was here about 2 weeks ago for same and had negative troponins but a mildly elevated D-dimer and had a CT that was negative for PE.  She followed up with her PCP today who recommended that she restart on her anticoagulation.  Her chest felt more uncomfortable this evening and so she presented for evaluation.  The history is provided by the patient.  Chest Pain Pain location:  Substernal area Pain quality: burning   Pain radiates to:  Does not radiate Pain severity:  Moderate Onset quality:  Gradual Timing:  Constant Progression:  Unchanged Chronicity:  Recurrent Relieved by:  None tried Worsened by:  Nothing Ineffective treatments:  None tried Associated symptoms: cough and shortness of breath   Associated symptoms: no abdominal pain, no diaphoresis, no dysphagia, no fever, no headache, no lower extremity edema, no nausea and no vomiting   Risk factors: prior DVT/PE        Past Medical History:  Diagnosis Date  . Medical history non-contributory     Patient Active Problem List   Diagnosis Date Noted  . Hypokalemia 05/16/2018  . Pulmonary emboli (HCC) 05/16/2018  . EKG abnormalities 05/16/2018  . Leucocytosis 05/16/2018  . Appendicitis, acute 05/03/2018  . Acute appendicitis 05/03/2018    Past Surgical History:  Procedure Laterality Date  . FRACTURE SURGERY    . GANGLION CYST EXCISION    . GANGLION CYST EXCISION Left 2003  . LAPAROSCOPIC APPENDECTOMY N/A 05/03/2018   Procedure: APPENDECTOMY  LAPAROSCOPIC;  Surgeon: Darnell Level, MD;  Location: [facility];  Service: General;  Laterality: N/A;  . WRIST FRACTURE SURGERY Right 2010     OB History   No obstetric history on file.     No family history on file.  Social History   Tobacco Use  . Smoking status: Never Smoker  . Smokeless tobacco: Never Used  Vaping Use  . Vaping Use: Never used  Substance Use Topics  . Alcohol use: Yes    Alcohol/week: 2.0 standard drinks    Types: 2 Glasses of wine per week    Comment: per week  . Drug use: Never    Home Medications Prior to Admission medications   Medication Sig Start Date End Date Taking? Authorizing Provider  aspirin 325 MG tablet Take 325 mg by mouth daily.   Yes [provider]  buPROPion HCl ER, XL, 450 MG TB24 Take 450 mg by mouth daily.  12/09/16  Yes [provider]  cyclobenzaprine (FLEXERIL) 10 MG tablet Take 10 mg by mouth 2 (two) times daily as needed for muscle spasms.   Yes [provider]  levonorgestrel (KYLEENA) 19.5 MG IUD 1 each by Intrauterine route once.   Yes [provider]  tretinoin (RETIN-A) 0.1 % cream Apply 1 application topically at bedtime.   Yes [provider]  Rivaroxaban 15 & 20 MG TBPK Take as directed on package: Start with one 15mg  tablet by mouth twice a day with  food. On Day 22, switch to one 20mg  tablet once a day with food. Patient not taking: Reported on 02/13/2020 05/15/18 02/13/20  13/11/21, PA-C    Allergies    Patient has no known allergies.  Review of Systems   Review of Systems  Constitutional: Negative for diaphoresis and fever.  HENT: Negative for sore throat and trouble swallowing.   Eyes: Negative for visual disturbance.  Respiratory: Positive for cough and shortness of breath.   Cardiovascular: Positive for chest pain.  Gastrointestinal: Negative for abdominal pain, nausea and vomiting.  Genitourinary: Negative for dysuria.  Musculoskeletal: Negative for neck pain.    Skin: Negative for rash.  Neurological: Negative for headaches.    Physical Exam Updated Vital Signs BP (!) 153/87 (BP Location: Right Arm)   Pulse 97   Temp 97.6 F (36.4 C) (Oral)   Resp 18   Ht 5\' 8"  (1.727 m)   Wt 88.5 kg   SpO2 100%   BMI 29.65 kg/m   Physical Exam Vitals and nursing note reviewed.  Constitutional:      General: She is not in acute distress.    Appearance: Normal appearance. She is well-developed.  HENT:     Head: Normocephalic and atraumatic.  Eyes:     Conjunctiva/sclera: Conjunctivae normal.  Cardiovascular:     Rate and Rhythm: Regular rhythm. Tachycardia present.     Heart sounds: No murmur heard.   Pulmonary:     Effort: Pulmonary effort is normal. No respiratory distress.     Breath sounds: Normal breath sounds.  Abdominal:     Palpations: Abdomen is soft.     Tenderness: There is no abdominal tenderness.  Musculoskeletal:        General: No tenderness, deformity or signs of injury. Normal range of motion.     Cervical back: Neck supple.     Right lower leg: No edema.     Left lower leg: No edema.  Skin:    General: Skin is warm and dry.  Neurological:     General: No focal deficit present.     Mental Status: She is alert.     ED Results / Procedures / Treatments   Labs (all labs ordered are listed, but only abnormal results are displayed) Labs Reviewed  BASIC METABOLIC PANEL - Abnormal; Notable for the following components:      Result Value   Potassium 3.2 (*)    Calcium 8.8 (*)    All other components within normal limits  CBC - Abnormal; Notable for the following components:   Hemoglobin 11.7 (*)    All other components within normal limits  I-STAT BETA HCG BLOOD, ED ([facility], [facility], [facility] ONLY)  TROPONIN I (HIGH SENSITIVITY)  TROPONIN I (HIGH SENSITIVITY)    EKG EKG Interpretation  Date/Time:  Tuesday February 25 2020 21:01:11 EST Ventricular Rate:  100 PR Interval:    QRS Duration: 87 QT Interval:  348 QTC  Calculation: 449 R Axis:   35 Text Interpretation: Sinus tachycardia Borderline T wave abnormalities 12 Lead; Mason-Likar No significant change since last tracing Confirmed by Thursday [phone]) on 02/25/2020 9:23:48 PM   Radiology DG Chest 2 View  Result Date: 02/25/2020 CLINICAL DATA:  Chest pain EXAM: CHEST - 2 VIEW COMPARISON:  02/13/2020 FINDINGS: The heart size and mediastinal contours are within normal limits. Both lungs are clear. The visualized skeletal structures are unremarkable. IMPRESSION: No active cardiopulmonary disease. Electronically Signed   By: 02/27/2020.D.  On: 02/25/2020 21:31    Procedures Procedures (including critical care time)  Medications Ordered in ED Medications  alum & mag hydroxide-simeth (MAALOX/MYLANTA) 200-200-20 MG/5ML suspension 30 mL (has no administration in time range)    And  lidocaine (XYLOCAINE) 2 % viscous mouth solution 15 mL (has no administration in time range)    ED Course  I have reviewed the triage vital signs and the nursing notes.  Pertinent labs & imaging results that were available during my care of the patient were reviewed by me and considered in my medical decision making (see chart for details).  Clinical Course as of Feb 26 935  Tue Feb 25, 2020  2148 Chest x-ray interpreted by me as no acute infiltrates.   [MB]  2214 Reassessed patient after GI cocktail, she said it did not change her discomfort at all.   [MB]  2222 I had a shared decision-making discussion with patient regarding whether we re- CT angio chest her.  She had a mildly positive D-dimer during her last visit and her CT was negative.  She had a positive D-dimer with her PCP today and PCP recommended restarting anticoagulation.  Her pulse ox is 100% on room air and she is not tachypneic.  She is electing to not undergo another CT.  She understands ww will get a delta troponin.   [MB]    Clinical Course User Index [MB] Terrilee Files, MD   MDM  Rules/Calculators/A&P                         This patient complains of burning chest pain and tightness shortness of breath; this involves an extensive number of treatment Options and is a complaint that carries with it a high risk of complications and Morbidity. The differential includes ACS, PE, pneumothorax, pneumonia, reflux, musculoskeletal  I ordered, reviewed and interpreted labs, which included CBC with normal white count, slightly lower hemoglobin, chemistries normal other than mildly low potassium and calcium, pregnancy test negative, troponins flat I ordered medication GI cocktail with no significant change.  IV Pepcid. I ordered imaging studies which included chest x-ray and I independently    visualized and interpreted imaging which showed no acute pulmonary disease  Previous records obtained and reviewed in epic including recent ED visit for similar presentation and negative work-up and also PCP visit today restarting her on her NOAC  After the interventions stated above, I reevaluated the patient and found patient to be satting well on room air with no tachypnea or tachycardia.  She was thought to oncoming provider to follow-up on second troponin.  If this is unremarkable likely can be discharged to follow-up outpatient with her PCP.  Patient comfortable with plan.  We will start her on a PPI.   Final Clinical Impression(s) / ED Diagnoses Final diagnoses:  Nonspecific chest pain    Rx / DC Orders ED Discharge Orders         Ordered    pantoprazole (PROTONIX) 20 MG tablet  Daily        02/25/20 2225           Terrilee Files, MD 02/26/20 [phone]

## 2020-02-25 NOTE — ED Triage Notes (Signed)
Patient here from home reporting chest pain that started tonight with tightness. States that her d-dimer was elevated but ct was negative "last time I was here".

## 2020-02-25 NOTE — ED Provider Notes (Signed)
Blood pressure 134/87, pulse 90, temperature 97.6 F (36.4 C), temperature source Oral, resp. rate (!) 22, height 5\' 8"  (1.727 m), weight 88.5 kg, SpO2 100 %.  Assuming care from Dr. .  In short, Nancy Patel is a 44 y.o. female with a chief complaint of Chest Pain .  Refer to the original H&P for additional details.  The current plan of care is to f/u on delta troponin and reassess.  12:43 AM  Delta troponin negative. Plan for discharge with patient re-starting her anticoagulation and following closely with her PCP.    59, MD 03/03/20 1034

## 2020-02-26 DIAGNOSIS — R0789 Other chest pain: Secondary | ICD-10-CM | POA: Diagnosis not present

## 2020-02-26 DIAGNOSIS — R7989 Other specified abnormal findings of blood chemistry: Secondary | ICD-10-CM | POA: Diagnosis not present

## 2020-02-26 LAB — TROPONIN I (HIGH SENSITIVITY): Troponin I (High Sensitivity): <2 ng/L (ref <18)

## 2020-03-03 DIAGNOSIS — F4323 Adjustment disorder with mixed anxiety and depressed mood: Secondary | ICD-10-CM | POA: Diagnosis not present

## 2020-03-05 DIAGNOSIS — F411 Generalized anxiety disorder: Secondary | ICD-10-CM | POA: Diagnosis not present

## 2020-03-05 DIAGNOSIS — F341 Dysthymic disorder: Secondary | ICD-10-CM | POA: Diagnosis not present

## 2020-03-05 DIAGNOSIS — F5081 Binge eating disorder: Secondary | ICD-10-CM | POA: Diagnosis not present

## 2020-03-13 DIAGNOSIS — F341 Dysthymic disorder: Secondary | ICD-10-CM | POA: Diagnosis not present

## 2020-03-13 DIAGNOSIS — F411 Generalized anxiety disorder: Secondary | ICD-10-CM | POA: Diagnosis not present

## 2020-03-17 DIAGNOSIS — F4323 Adjustment disorder with mixed anxiety and depressed mood: Secondary | ICD-10-CM | POA: Diagnosis not present

## 2020-03-24 DIAGNOSIS — F4323 Adjustment disorder with mixed anxiety and depressed mood: Secondary | ICD-10-CM | POA: Diagnosis not present

## 2020-04-07 DIAGNOSIS — F4323 Adjustment disorder with mixed anxiety and depressed mood: Secondary | ICD-10-CM | POA: Diagnosis not present

## 2020-04-10 DIAGNOSIS — D509 Iron deficiency anemia, unspecified: Secondary | ICD-10-CM | POA: Insufficient documentation

## 2020-04-10 DIAGNOSIS — F3341 Major depressive disorder, recurrent, in partial remission: Secondary | ICD-10-CM | POA: Diagnosis not present

## 2020-04-10 DIAGNOSIS — F419 Anxiety disorder, unspecified: Secondary | ICD-10-CM | POA: Diagnosis not present

## 2020-04-10 DIAGNOSIS — Z7901 Long term (current) use of anticoagulants: Secondary | ICD-10-CM | POA: Diagnosis not present

## 2020-04-10 DIAGNOSIS — D6851 Activated protein C resistance: Secondary | ICD-10-CM | POA: Diagnosis not present

## 2020-04-14 ENCOUNTER — Ambulatory Visit (INDEPENDENT_AMBULATORY_CARE_PROVIDER_SITE_OTHER): Payer: BC Managed Care – PPO

## 2020-04-14 ENCOUNTER — Encounter: Payer: Self-pay | Admitting: Cardiovascular Disease

## 2020-04-14 ENCOUNTER — Ambulatory Visit: Payer: BC Managed Care – PPO | Admitting: Cardiovascular Disease

## 2020-04-14 ENCOUNTER — Encounter: Payer: Self-pay | Admitting: *Deleted

## 2020-04-14 ENCOUNTER — Other Ambulatory Visit: Payer: Self-pay

## 2020-04-14 VITALS — BP 138/78 | HR 79 | Ht 68.0 in | Wt 212.0 lb

## 2020-04-14 DIAGNOSIS — R079 Chest pain, unspecified: Secondary | ICD-10-CM

## 2020-04-14 DIAGNOSIS — R002 Palpitations: Secondary | ICD-10-CM

## 2020-04-14 DIAGNOSIS — R0789 Other chest pain: Secondary | ICD-10-CM

## 2020-04-14 DIAGNOSIS — D6851 Activated protein C resistance: Secondary | ICD-10-CM

## 2020-04-14 DIAGNOSIS — K7689 Other specified diseases of liver: Secondary | ICD-10-CM | POA: Diagnosis not present

## 2020-04-14 HISTORY — DX: Palpitations: R00.2

## 2020-04-14 HISTORY — DX: Activated protein C resistance: D68.51

## 2020-04-14 HISTORY — DX: Other specified diseases of liver: K76.89

## 2020-04-14 HISTORY — DX: Other chest pain: R07.89

## 2020-04-14 LAB — COMPREHENSIVE METABOLIC PANEL
ALT: 20 IU/L (ref 0–32)
AST: 18 IU/L (ref 0–40)
Albumin/Globulin Ratio: 1.7 (ref 1.2–2.2)
Albumin: 4.2 g/dL (ref 3.8–4.8)
Alkaline Phosphatase: 94 IU/L (ref 44–121)
BUN/Creatinine Ratio: 8 — ABNORMAL LOW (ref 9–23)
BUN: 6 mg/dL (ref 6–24)
Bilirubin Total: <0.2 mg/dL (ref 0.0–1.2)
CO2: 20 mmol/L (ref 20–29)
Calcium: 8.7 mg/dL (ref 8.7–10.2)
Chloride: 104 mmol/L (ref 96–106)
Creatinine, Ser: 0.78 mg/dL (ref 0.57–1.00)
GFR calc Af Amer: 106 mL/min/{1.73_m2} (ref >59)
GFR calc non Af Amer: 92 mL/min/{1.73_m2} (ref >59)
Globulin, Total: 2.5 g/dL (ref 1.5–4.5)
Glucose: 85 mg/dL (ref 65–99)
Potassium: 4.5 mmol/L (ref 3.5–5.2)
Sodium: 138 mmol/L (ref 134–144)
Total Protein: 6.7 g/dL (ref 6.0–8.5)

## 2020-04-14 LAB — MAGNESIUM: Magnesium: 2.3 mg/dL (ref 1.6–2.3)

## 2020-04-14 MED ORDER — METOPROLOL TARTRATE 100 MG PO TABS: ORAL_TABLET | ORAL | 0 refills | Status: DC

## 2020-04-14 NOTE — Patient Instructions (Addendum)
Medication Instructions:  STOP XARELTO   START ASPIRIN 81 MG DAILY   TAKE METOPROLOL SUCC 100 MG 2 HOURS PRIOR TO CARDIAC CT   *If you need a refill on your cardiac medications before your next appointment, please call your pharmacy*  Lab Work: CMET/MAGNESIUM TODAY   If you have labs (blood work) drawn today and your tests are completely normal, you will receive your results only by: Marland Kitchen MyChart Message (if you have MyChart) OR . A paper copy in the mail If you have any lab test that is abnormal or we need to change your treatment, we will call you to review the results.  Testing/Procedures: Your physician has requested that you have cardiac CT. Cardiac computed tomography (CT) is a painless test that uses an x-ray machine to take clear, detailed pictures of your heart. For further information please visit [email or web address].[email or web address]. Please follow instruction sheet as given. THE OFFICE WILL CALL YOU TO SCHEDULE ONCE INSURANCE HAS BEEN REVIEWED   3 DAY ZIO HEART MONITOR  THIS WILL BE MAILED TO YOU   Follow-Up: At [facility], you and your health needs are our priority.  As part of our continuing mission to provide you with exceptional heart care, we have created designated Provider Care Teams.  These Care Teams include your primary Cardiologist (physician) and Advanced Practice Providers (APPs -  Physician Assistants and Nurse Practitioners) who all work together to provide you with the care you need, when you need it.  We recommend signing up for the patient portal called "MyChart".  Sign up information is provided on this After Visit Summary.  MyChart is used to connect with patients for Virtual Visits (Telemedicine).  Patients are able to view lab/test results, encounter notes, upcoming appointments, etc.  Non-urgent messages can be sent to your provider as well.   To learn more about what you can do with MyChart, go to [email or web address].    Your next appointment:   6-8   week(s)  The format for your next appointment:   In Person  Provider:   You may see DR [facility]  or one of the following Advanced Practice Providers on your designated Care Team:    Kerin Ransom, PA-C  [place], [place]  Coletta Memos, [place]  Other Instructions Your cardiac CT will be scheduled at one of the below locations:   [facility] [address] [place], [place] [address] [phone]  [place] [address] [place],  [address] [phone]  If scheduled at [facility], please arrive at the [facility] main entrance of [facility] 30 minutes prior to test start time. Proceed to the [facility] Radiology Department (first floor) to check-in and test prep.  If scheduled at [facility], please arrive 15 mins early for check-in and test prep.  Please follow these instructions carefully (unless otherwise directed):  Hold all erectile dysfunction medications at least 3 days (72 hrs) prior to test.  On the Night Before the Test: . Be sure to Drink plenty of water. . Do not consume any caffeinated/decaffeinated beverages or chocolate 12 hours prior to your test. . Do not take any antihistamines 12 hours prior to your test. . If the patient has contrast allergy: ? Patient will need a prescription for Prednisone and very clear instructions (as follows): 1. Prednisone 50 mg - take 13 hours prior to test 2. Take another Prednisone 50 mg 7 hours prior to test  3. Take another Prednisone 50 mg 1 hour prior to test 4. Take Benadryl 50 mg 1 hour prior to test . Patient must complete all four doses of above prophylactic medications. . Patient will need a ride after test due to Benadryl.  On the Day of the Test: . Drink plenty of water. Do not drink any water within one hour of the test. . Do not eat any food 4 hours prior to the test. . You may take your  regular medications prior to the test.  . Take metoprolol (Lopressor) two hours prior to test. . HOLD Furosemide/Hydrochlorothiazide morning of the test. . FEMALES- please wear underwire-free bra if available      After the Test: . Drink plenty of water. . After receiving IV contrast, you may experience a mild flushed feeling. This is normal. . On occasion, you may experience a mild rash up to 24 hours after the test. This is not dangerous. If this occurs, you can take Benadryl 25 mg and increase your fluid intake. . If you experience trouble breathing, this can be serious. If it is severe call 911 IMMEDIATELY. If it is mild, please call our office. . If you take any of these medications: Glipizide/Metformin, Avandament, Glucavance, please do not take 48 hours after completing test unless otherwise instructed.   Once we have confirmed authorization from your insurance company, we will call you to set up a date and time for your test. Based on how quickly your insurance processes prior authorizations requests, please allow up to 4 weeks to be contacted for scheduling your Cardiac CT appointment. Be advised that routine Cardiac CT appointments could be scheduled as many as 8 weeks after your provider has ordered it.  For non-scheduling related questions, please contact the cardiac imaging nurse navigator should you have any questions/concerns: Marchia Bond, Cardiac Imaging Nurse Navigator Burley Saver, Interim Cardiac Imaging Nurse [facility] Direct Office Dial: [phone]   For scheduling needs, including cancellations and rescheduling, please call [place], [phone].  ZIO XT- Long Term Monitor Instructions   Your physician has requested you wear your ZIO patch monitor__3___days.   This is a single patch monitor.  [employer] supplies one patch monitor per enrollment.  Additional stickers are not available.   Please do not apply patch if you will be  having a Nuclear Stress Test, Echocardiogram, Cardiac CT, MRI, or Chest Xray during the time frame you would be wearing the monitor. The patch cannot be worn during these tests.  You cannot remove and re-apply the ZIO XT patch monitor.   Your ZIO patch monitor will be sent USPS Priority mail from [facility] directly to your home address. The monitor may also be mailed to a PO BOX if home delivery is not available.   It may take 3-5 days to receive your monitor after you have been enrolled.   Once you have received you monitor, please review enclosed instructions.  Your monitor has already been registered assigning a specific monitor serial # to you.   Applying the monitor   Shave hair from upper left chest.   Hold abrader disc by orange tab.  Rub abrader in 40 strokes over left upper chest as indicated in your monitor instructions.   Clean area with 4 enclosed alcohol pads .  Use all pads to assure are is cleaned thoroughly.  Let dry.   Apply patch as indicated in monitor instructions.  Patch will be place under collarbone on left  side of chest with arrow pointing upward.   Rub patch adhesive wings for 2 minutes.Remove white label marked "1".  Remove white label marked "2".  Rub patch adhesive wings for 2 additional minutes.   While looking in a mirror, press and release button in center of patch.  A small green light will flash 3-4 times .  This will be your only indicator the monitor has been turned on.     Do not shower for the first 24 hours.  You may shower after the first 24 hours.   Press button if you feel a symptom. You will hear a small click.  Record Date, Time and Symptom in the Patient Log Book.   When you are ready to remove patch, follow instructions on last 2 pages of Patient Log Book.  Stick patch monitor onto last page of Patient Log Book.   Place Patient Log Book in [place] box.  Use locking tab on box and tape box closed securely.  The [employer] has  [employer] on it.  Please place in mailbox as soon as possible.  Your physician should have your test results approximately 7 days after the monitor has been mailed back to [facility].   Call [place] at [phone] if you have questions regarding your ZIO XT patch monitor.  Call them immediately if you see an orange light blinking on your monitor.   If your monitor falls off in less than 4 days contact our Monitor department at [phone].  If your monitor becomes loose or falls off after 4 days call [employer] at [phone] for suggestions on securing your monitor.   Cardiac CT Angiogram A cardiac CT angiogram is a procedure to look at the heart and the area around the heart. It may be done to help find the cause of chest pains or other symptoms of heart disease. During this procedure, a substance called contrast dye is injected into the blood vessels in the area to be checked. A large X-ray machine, called a CT scanner, then takes detailed pictures of the heart and the surrounding area. The procedure is also sometimes called a coronary CT angiogram, coronary artery scanning, or CTA. A cardiac CT angiogram allows the health care provider to see how well blood is flowing to and from the heart. The health care provider will be able to see if there are any problems, such as:  Blockage or narrowing of the coronary arteries in the heart.  Fluid around the heart.  Signs of weakness or disease in the muscles, valves, and tissues of the heart. Tell a health care provider about:  Any allergies you have. This is especially important if you have had a previous allergic reaction to contrast dye.  All medicines you are taking, including vitamins, herbs, eye drops, creams, and over-the-counter medicines.  Any blood disorders you have.  Any surgeries you have had.  Any medical conditions you have.  Whether you are pregnant or may be pregnant.  Any anxiety disorders,  chronic pain, or other conditions you have that may increase your stress or prevent you from lying still. What are the risks? Generally, this is a safe procedure. However, problems may occur, including:  Bleeding.  Infection.  Allergic reactions to medicines or dyes.  Damage to other structures or organs.  Kidney damage from the contrast dye that is used.  Increased risk of cancer from radiation exposure. This risk is low. Talk with your health care provider about: ?  The risks and benefits of testing. ? How you can receive the lowest dose of radiation. What happens before the procedure?  Wear comfortable clothing and remove any jewelry, glasses, dentures, and hearing aids.  Follow instructions from your health care provider about eating and drinking. This may include: ? For 12 hours before the procedure -- avoid caffeine. This includes tea, coffee, soda, energy drinks, and diet pills. Drink plenty of water or other fluids that do not have caffeine in them. Being well hydrated can prevent complications. ? For 4-6 hours before the procedure -- stop eating and drinking. The contrast dye can cause nausea, but this is less likely if your stomach is empty.  Ask your health care provider about changing or stopping your regular medicines. This is especially important if you are taking diabetes medicines, blood thinners, or medicines to treat problems with erections (erectile dysfunction). What happens during the procedure?  Hair on your chest may need to be removed so that small sticky patches called electrodes can be placed on your chest. These will transmit information that helps to monitor your heart during the procedure.  An IV will be inserted into one of your veins.  You might be given a medicine to control your heart rate during the procedure. This will help to ensure that good images are obtained.  You will be asked to lie on an exam table. This table will slide in and out of the CT  machine during the procedure.  Contrast dye will be injected into the IV. You might feel warm, or you may get a metallic taste in your mouth.  You will be given a medicine called nitroglycerin. This will relax or dilate the arteries in your heart.  The table that you are lying on will move into the CT machine tunnel for the scan.  The person running the machine will give you instructions while the scans are being done. You may be asked to: ? Keep your arms above your head. ? Hold your breath. ? Stay very still, even if the table is moving.  When the scanning is complete, you will be moved out of the machine.  The IV will be removed. The procedure may vary among health care providers and hospitals.   What can I expect after the procedure? After your procedure, it is common to have:  A metallic taste in your mouth from the contrast dye.  A feeling of warmth.  A headache from the nitroglycerin. Follow these instructions at home:  Take over-the-counter and prescription medicines only as told by your health care provider.  If you are told, drink enough fluid to keep your urine pale yellow. This will help to flush the contrast dye out of your body.  Most people can return to their normal activities right after the procedure. Ask your health care provider what activities are safe for you.  It is up to you to get the results of your procedure. Ask your health care provider, or the department that is doing the procedure, when your results will be ready.  Keep all follow-up visits as told by your health care provider. This is important. Contact a health care provider if:  You have any symptoms of allergy to the contrast dye. These include: ? Shortness of breath. ? Rash or hives. ? A racing heartbeat. Summary  A cardiac CT angiogram is a procedure to look at the heart and the area around the heart. It may be done to help find the  cause of chest pains or other symptoms of heart  disease.  During this procedure, a large X-ray machine, called a CT scanner, takes detailed pictures of the heart and the surrounding area after a contrast dye has been injected into blood vessels in the area.  Ask your health care provider about changing or stopping your regular medicines before the procedure. This is especially important if you are taking diabetes medicines, blood thinners, or medicines to treat erectile dysfunction.  If you are told, drink enough fluid to keep your urine pale yellow. This will help to flush the contrast dye out of your body. This information is not intended to replace advice given to you by your health care provider. Make sure you discuss any questions you have with your health care provider. Document Revised: 11/14/2018 Document Reviewed: 11/14/2018 Elsevier Patient Education  2021 [employer].

## 2020-04-14 NOTE — Progress Notes (Signed)
Patient ID: Nancy Patel, female   DOB: 10-16-1975, 45 y.o.   MRN: [id number] Patient enrolled for [employer] to ship a 3 day ZIO XT long term holter monitor to her home.

## 2020-04-14 NOTE — Progress Notes (Signed)
Cardiology Office Note   Date:  04/14/2020   ID:  Nancy Patel, DOB 03-13-1976, MRN [id number]  PCP:  Laqueta Due., MD  Cardiologist:   Chilton Si, MD   No chief complaint on file.     History of Present Illness: Nancy Patel is a 45 y.o. female with factor V Leiden, VSD, and prior pulmonary embolism who is being seen today for the evaluation of chest discomfort at the request of Laqueta Due., MD.  She noted intermittent chest tightness for several days.  It felt like her heart was clenched.  There was no associated nausea, shortness of breath, or diaphoresis.  The episodes were not associated with exertion.  She first to urgent care and they recommended going to the ED because her d-dimer was high. Nancy Patel was seen in the ED 02/2020 with chest pain.  Cardiac enzymes were less than 2 x2.  Potassium was 3.2.  EKG was notable for sinus rhythm at 100 bpm and some nonspecific T wave abnormalities.  She also felt like her heart was racing.  CT-A was negative for PE.  Her prior pulmonary embolism occurred in the setting of postoperative state.  She did see a hematologist who recommended that after completing her dose of Xarelto she be treated with chronic aspirin therapy.  Since his most recent episode she was told to resume Xarelto.  Nancy Patel does not regularly check blood pressure at home.  She does have a watch that monitors her heart rate.  At baseline her heart rate is usually in the 70s or 80s.  Most recently it has been in the 90s at rest.  She has been trying to walk for exercise about an hour a day 3 days/week.  She has no exertional chest pain and her breathing has been stable.  She denies lower extremity edema, orthopnea, or PND.  She has been able to exercise since her episode and did not have any symptoms.  She drinks the equivalent of 3 to 4 cups of coffee daily.  She does not smoke and drinks alcohol approximately twice per week.  Lately she has noted night sweats but  denies fevers or chills.  She continues have episodes of palpitations that occur randomly.   Past Medical History:  Diagnosis Date  . Atypical chest pain 04/14/2020  . Factor V Leiden (HCC) 04/14/2020  . Hepatic cyst 04/14/2020  . Medical history non-contributory   . Palpitations 04/14/2020    Past Surgical History:  Procedure Laterality Date  . FRACTURE SURGERY    . GANGLION CYST EXCISION    . GANGLION CYST EXCISION Left 2003  . LAPAROSCOPIC APPENDECTOMY N/A 05/03/2018   Procedure: APPENDECTOMY LAPAROSCOPIC;  Surgeon: Darnell Level, MD;  Location: [facility]S;  Service: General;  Laterality: N/A;  . WRIST FRACTURE SURGERY Right 2010     Current Outpatient Medications  Medication Sig Dispense Refill  . aspirin EC 81 MG tablet Take 81 mg by mouth daily. Swallow whole.    Marland Kitchen buPROPion (WELLBUTRIN XL) 300 MG 24 hr tablet Take by mouth.    . ferrous sulfate 325 (65 FE) MG tablet Take by mouth.    . levonorgestrel (KYLEENA) 19.5 MG IUD 1 each by Intrauterine route once.    . metoprolol tartrate (LOPRESSOR) 100 MG tablet TAKE 1 TABLET 2 HOURS PRIOR TO CT 1 tablet 0  . sertraline (ZOLOFT) 100 MG tablet     . tretinoin (RETIN-A) 0.1 % cream Apply 1 application topically at bedtime.    Marland Kitchen  valACYclovir (VALTREX) 500 MG tablet Take 500 mg by mouth daily.     No current facility-administered medications for this visit.    Allergies:   Patient has no known allergies.    Social History:  The patient  reports that she has never smoked. She has never used smokeless tobacco. She reports current alcohol use of about 2.0 standard drinks of alcohol per week. She reports that she does not use drugs.   Family History:  The patient's family history includes Clotting disorder in her brother; Heart attack in her maternal grandfather and maternal grandmother; Hypertension in her father and mother; Stroke in her mother.    ROS:  Please see the history of present illness.   Otherwise, review of systems are  positive for none.   All other systems are reviewed and negative.    PHYSICAL EXAM: VS:  BP 138/78   Pulse 79   Ht 5\' 8"  (1.727 m)   Wt 212 lb (96.2 kg)   SpO2 97%   BMI 32.23 kg/m  , BMI Body mass index is 32.23 kg/m. GENERAL:  Well appearing HEENT:  Pupils equal round and reactive, fundi not visualized, oral mucosa unremarkable NECK:  No jugular venous distention, waveform within normal limits, carotid upstroke brisk and symmetric, no bruits, no thyromegaly LYMPHATICS:  No cervical adenopathy LUNGS:  Clear to auscultation bilaterally HEART:  RRR.  PMI not displaced or sustained,S1 and S2 within normal limits, no S3, no S4, no clicks, no rubs, no murmurs ABD:  Flat, positive bowel sounds normal in frequency in pitch, no bruits, no rebound, no guarding, no midline pulsatile mass, no hepatomegaly, no splenomegaly EXT:  2 plus pulses throughout, no edema, no cyanosis no clubbing SKIN:  No rashes no nodules NEURO:  Cranial nerves II through XII grossly intact, motor grossly intact throughout PSYCH:  Cognitively intact, oriented to person place and time    EKG:  EKG is ordered today. The ekg ordered today demonstrates sinus rhythm.  Rate 79 bpm.  Non-specific ST abnormality  Echo 05/2018: 1. The left ventricle has normal systolic function with an ejection  fraction of 60-65%. The cavity size was normal. Left ventricular diastolic  Doppler parameters are consistent with pseudonormalization No evidence of  left ventricular regional wall  motion abnormalities.  2. The right ventricle has normal systolic function. The cavity was  normal. There is no increase in right ventricular wall thickness.  3. The mitral valve is normal in structure.  4. The tricuspid valve is normal in structure.  5. The aortic valve is normal in structure.  6. The aortic root and ascending aorta are normal in size and structure.  7. No evidence of left ventricular regional wall motion abnormalities.   8. Right atrial pressure is estimated at 3 mmHg.  9. The interatrial septum was not assessed.   Recent Labs: 02/13/2020: TSH 1.796 02/25/2020: BUN 9; Creatinine, Ser 0.73; Hemoglobin 11.7; Platelets 316; Potassium 3.2; Sodium 140    Lipid Panel No results found for: CHOL, TRIG, HDL, CHOLHDL, VLDL, LDLCALC, LDLDIRECT    Wt Readings from Last 3 Encounters:  04/14/20 212 lb (96.2 kg)  02/25/20 195 lb (88.5 kg)  02/13/20 195 lb (88.5 kg)      ASSESSMENT AND PLAN:  # Atypical chest pain:   Symptoms are atypical.  We will get a coronary CTA to better define coronary anatomy and ensure that she does not have any obstructive coronary artery disease  # Hypertension:  Blood pressure is elevated  here.  It did improve somewhat on repeat.  Ideally her blood pressure be less than 130/80.  She does have some room to work on diet and exercise.  She is going to try increasing her exercise and limiting sodium intake to 1500 milligrams daily.  She will track her blood pressures at home and bring to follow-up.  If her blood pressure remains elevated consider starting an antihypertensive at that time.  # Palpitations:  Check a CMP, magnesium.  Thyroid was normal.  Her potassium was low in the hospital.  She may require supplementation.  We will get a 3-day ZIO to better assess her rhythm when she is having symptoms.  # VSD:   She reports that she had a small VSD.  There is no residual VSD on her last echo.  # Hepatic cysts:   She was noted to have hepatic cysts and an angioma on her chest CT 04/2018.  She will follow-up with her PCP as they did recommend abdominal ultrasound to better characterize hemangioma.  # Factor V Leiden:  # Prior PE:  Most recent work-up was negative for pulmonary embolism.  Seems that nothing is truly different than when she had her prior episode of pulmonary embolism.  Her hematologist recommended daily aspirin when she completed her anticoagulation therapy.  We will  switch back to aspirin.  Stop Xarelto.   Current medicines are reviewed at length with the patient today.  The patient does not have concerns regarding medicines.  The following changes have been made:  no change  Labs/ tests ordered today include:   Orders Placed This Encounter  Procedures  . CT CORONARY MORPH W/CTA COR W/SCORE W/CA W/CM &/OR WO/CM  . CT CORONARY FRACTIONAL FLOW RESERVE DATA PREP  . CT CORONARY FRACTIONAL FLOW RESERVE FLUID ANALYSIS  . Magnesium  . Comprehensive metabolic panel  . LONG TERM MONITOR (3-14 DAYS)  . EKG 12-Lead     Disposition:   FU with Tiffany C. Duke Salvia, MD, [facility] in 6-8 weeks.     Signed, Tiffany C. Duke Salvia, MD, [facility]  04/14/2020 1:10 PM    [facility]

## 2020-04-16 DIAGNOSIS — F341 Dysthymic disorder: Secondary | ICD-10-CM | POA: Diagnosis not present

## 2020-04-16 DIAGNOSIS — F5081 Binge eating disorder: Secondary | ICD-10-CM | POA: Diagnosis not present

## 2020-04-17 DIAGNOSIS — Z1231 Encounter for screening mammogram for malignant neoplasm of breast: Secondary | ICD-10-CM | POA: Diagnosis not present

## 2020-04-21 DIAGNOSIS — F4323 Adjustment disorder with mixed anxiety and depressed mood: Secondary | ICD-10-CM | POA: Diagnosis not present

## 2020-04-24 DIAGNOSIS — Z1151 Encounter for screening for human papillomavirus (HPV): Secondary | ICD-10-CM | POA: Diagnosis not present

## 2020-04-24 DIAGNOSIS — E669 Obesity, unspecified: Secondary | ICD-10-CM | POA: Insufficient documentation

## 2020-04-24 DIAGNOSIS — Z30431 Encounter for routine checking of intrauterine contraceptive device: Secondary | ICD-10-CM | POA: Diagnosis not present

## 2020-04-24 DIAGNOSIS — Z01419 Encounter for gynecological examination (general) (routine) without abnormal findings: Secondary | ICD-10-CM | POA: Diagnosis not present

## 2020-04-25 DIAGNOSIS — R079 Chest pain, unspecified: Secondary | ICD-10-CM | POA: Diagnosis not present

## 2020-04-25 DIAGNOSIS — R002 Palpitations: Secondary | ICD-10-CM

## 2020-04-30 ENCOUNTER — Telehealth (HOSPITAL_COMMUNITY): Payer: Self-pay | Admitting: *Deleted

## 2020-04-30 NOTE — Telephone Encounter (Signed)
Attempted to call patient regarding upcoming cardiac CT appointment. Left message on voicemail with name and callback number  Yaritzy Huser RN Navigator Cardiac Imaging [facility] [phone] Office [phone] Cell  

## 2020-04-30 NOTE — Telephone Encounter (Signed)
Pt returning call regarding upcoming cardiac imaging study; pt verbalizes understanding of appt date/time, parking situation and where to check in, pre-test NPO status and medications ordered, and verified current allergies; name and call back number provided for further questions should they arise  Rosemarie Galvis RN Navigator Cardiac Imaging  Heart and Vascular [phone] office [phone] cell  

## 2020-05-01 ENCOUNTER — Ambulatory Visit (HOSPITAL_COMMUNITY)
Admission: RE | Admit: 2020-05-01 | Discharge: 2020-05-01 | Disposition: A | Discharge status: Home / Self Care | Payer: BC Managed Care – PPO | Source: Ambulatory Visit | Attending: Cardiovascular Disease | Admitting: Cardiovascular Disease

## 2020-05-01 ENCOUNTER — Other Ambulatory Visit: Payer: Self-pay

## 2020-05-01 DIAGNOSIS — Z006 Encounter for examination for normal comparison and control in clinical research program: Secondary | ICD-10-CM

## 2020-05-01 DIAGNOSIS — R079 Chest pain, unspecified: Secondary | ICD-10-CM | POA: Diagnosis not present

## 2020-05-01 DIAGNOSIS — R002 Palpitations: Secondary | ICD-10-CM | POA: Diagnosis not present

## 2020-05-01 MED ORDER — IOHEXOL 350 MG/ML SOLN
80.0000 mL | Freq: Once | INTRAVENOUS | Status: AC | PRN
  Administered 2020-05-01: 80 mL via INTRAVENOUS

## 2020-05-01 MED ORDER — NITROGLYCERIN 0.4 MG SL SUBL
0.8000 mg | SUBLINGUAL_TABLET | Freq: Once | SUBLINGUAL | Status: AC
  Administered 2020-05-01: 0.8 mg via SUBLINGUAL

## 2020-05-01 MED ORDER — NITROGLYCERIN 0.4 MG SL SUBL
SUBLINGUAL_TABLET | SUBLINGUAL | Status: AC
  Filled 2020-05-01: qty 2

## 2020-05-01 NOTE — Progress Notes (Signed)
CT scan completed. Tolerated well. D/C home ambulatory, awake and alert. In no dsitress

## 2020-05-01 NOTE — Research (Signed)
IDENTIFY Informed Consent   Subject Name: Nancy Patel  Subject met inclusion and exclusion criteria.  The informed consent form, study requirements and expectations were reviewed with the subject and questions and concerns were addressed prior to the signing of the consent form.  The subject verbalized understanding of the trial requirements.  The subject agreed to participate in the IDENTIFY trial and signed the informed consent at 1252 on 28/JAN/2022.  The informed consent was obtained prior to performance of any protocol-specific procedures for the subject.  A copy of the signed informed consent was given to the subject and a copy was placed in the subject's medical record.   Sherlyn Lees

## 2020-05-05 DIAGNOSIS — R002 Palpitations: Secondary | ICD-10-CM | POA: Diagnosis not present

## 2020-05-05 DIAGNOSIS — F4323 Adjustment disorder with mixed anxiety and depressed mood: Secondary | ICD-10-CM | POA: Diagnosis not present

## 2020-05-05 DIAGNOSIS — R079 Chest pain, unspecified: Secondary | ICD-10-CM | POA: Diagnosis not present

## 2020-05-13 DIAGNOSIS — F4323 Adjustment disorder with mixed anxiety and depressed mood: Secondary | ICD-10-CM | POA: Diagnosis not present

## 2020-05-19 DIAGNOSIS — F4323 Adjustment disorder with mixed anxiety and depressed mood: Secondary | ICD-10-CM | POA: Diagnosis not present

## 2020-05-21 DIAGNOSIS — F341 Dysthymic disorder: Secondary | ICD-10-CM | POA: Diagnosis not present

## 2020-05-21 DIAGNOSIS — F5081 Binge eating disorder: Secondary | ICD-10-CM | POA: Diagnosis not present

## 2020-06-02 DIAGNOSIS — F4323 Adjustment disorder with mixed anxiety and depressed mood: Secondary | ICD-10-CM | POA: Diagnosis not present

## 2020-06-16 DIAGNOSIS — F4323 Adjustment disorder with mixed anxiety and depressed mood: Secondary | ICD-10-CM | POA: Diagnosis not present

## 2020-06-18 ENCOUNTER — Ambulatory Visit: Payer: BC Managed Care – PPO | Admitting: Cardiovascular Disease

## 2020-06-18 ENCOUNTER — Other Ambulatory Visit: Payer: Self-pay

## 2020-06-18 ENCOUNTER — Encounter: Payer: Self-pay | Admitting: Cardiovascular Disease

## 2020-06-18 VITALS — BP 128/88 | HR 98 | Ht 68.0 in | Wt 205.4 lb

## 2020-06-18 DIAGNOSIS — I4729 Other ventricular tachycardia: Secondary | ICD-10-CM

## 2020-06-18 DIAGNOSIS — I472 Ventricular tachycardia: Secondary | ICD-10-CM

## 2020-06-18 DIAGNOSIS — I2693 Single subsegmental pulmonary embolism without acute cor pulmonale: Secondary | ICD-10-CM

## 2020-06-18 DIAGNOSIS — R0789 Other chest pain: Secondary | ICD-10-CM

## 2020-06-18 HISTORY — DX: Ventricular tachycardia: I47.2

## 2020-06-18 HISTORY — DX: Other ventricular tachycardia: I47.29

## 2020-06-18 MED ORDER — METOPROLOL TARTRATE 25 MG PO TABS: ORAL_TABLET | ORAL | 3 refills | Status: AC

## 2020-06-18 NOTE — Progress Notes (Signed)
Cardiology Office Note  Date:  06/18/2020   ID:  Nancy Patel, DOB 09/02/1975, MRN [id number]  PCP:  Laqueta Due., MD  Cardiologist:   Chilton Si, MD   No chief complaint on file.    History of Present Illness: Nancy Patel is a 45 y.o. female with factor V Leiden, VSD, and prior pulmonary embolism here for follow up.   She was initially seen 04/2020 for the evaluation of chest discomfort.  She noted intermittent chest tightness for several days.  She first to urgent care and they recommended going to the ED because her d-dimer was high.  CT-A was negative for PE.  Her prior pulmonary embolism occurred in the setting of postoperative state.  She did see a hematologist who recommended that after completing her dose of Xarelto she be treated with chronic aspirin therapy due to Factor V Leiden.  She was referred for coronary CT-Kabbe on 04/2020 that revealed no coronary artery disease.  Her calcium score was 0.  She also reported some intermittent palpitations.  She wore a 3-day ZIO that revealed a run of NSVT that was asymptomatic.  She was offered metoprolol as needed but declined.  Lately she has been feeling well.  She did recently move and has been very active with packing and rearranging.  She has not had any exertional chest pain or shortness of breath.  She very rarely has palpitations she denies lower extremity edema, orthopnea, or PND.  Past Medical History:  Diagnosis Date  . Atypical chest pain 04/14/2020  . Factor V Leiden (HCC) 04/14/2020  . Hepatic cyst 04/14/2020  . Medical history non-contributory   . NSVT (nonsustained ventricular tachycardia) (HCC) 06/18/2020  . Palpitations 04/14/2020    Past Surgical History:  Procedure Laterality Date  . FRACTURE SURGERY    . GANGLION CYST EXCISION    . GANGLION CYST EXCISION Left 2003  . LAPAROSCOPIC APPENDECTOMY N/A 05/03/2018   Procedure: APPENDECTOMY LAPAROSCOPIC;  Surgeon: Darnell Level, MD;  Location: [facility];  Service:  General;  Laterality: N/A;  . WRIST FRACTURE SURGERY Right 2010     Current Outpatient Medications  Medication Sig Dispense Refill  . aspirin EC 81 MG tablet Take 81 mg by mouth daily. Swallow whole.    Marland Kitchen buPROPion (WELLBUTRIN XL) 300 MG 24 hr tablet Take by mouth.    . ferrous sulfate 325 (65 FE) MG tablet Take by mouth.    . levonorgestrel (KYLEENA) 19.5 MG IUD 1 each by Intrauterine route once.    . metoprolol tartrate (LOPRESSOR) 25 MG tablet EVERY 12 HOURS AS NEEDED FOR PALPITATIONS 90 tablet 3  . sertraline (ZOLOFT) 100 MG tablet daily.    Marland Kitchen tretinoin (RETIN-A) 0.1 % cream Apply 1 application topically at bedtime.    . valACYclovir (VALTREX) 500 MG tablet Take 500 mg by mouth daily.    . Cholecalciferol (VITAMIN D3) 1.25 MG (50000 UT) CAPS Take 1 capsule by mouth once a week.    . mirtazapine (REMERON) 15 MG tablet Take 15 mg by mouth at bedtime.    Marland Kitchen SAXENDA 18 MG/3ML SOPN Inject 18 mg into the skin daily.     No current facility-administered medications for this visit.    Allergies:   Patient has no known allergies.    Social History:  The patient  reports that she has never smoked. She has never used smokeless tobacco. She reports current alcohol use of about 2.0 standard drinks of alcohol per week. She reports that she does  not use drugs.   Family History:  The patient's family history includes Clotting disorder in her brother; Heart attack in her maternal grandfather and maternal grandmother; Hypertension in her father and mother; Stroke in her mother.    ROS:  Please see the history of present illness.   Otherwise, review of systems are positive for none.   All other systems are reviewed and negative.    PHYSICAL EXAM: VS:  BP 128/88   Pulse 98   Ht 5\' 8"  (1.727 m)   Wt 205 lb 6.4 oz (93.2 kg)   SpO2 99%   BMI 31.23 kg/m  , BMI Body mass index is 31.23 kg/m. GENERAL:  Well appearing HEENT: Pupils equal round and reactive, fundi not visualized, oral mucosa  unremarkable NECK:  No jugular venous distention, waveform within normal limits, carotid upstroke brisk and symmetric, no bruits, no thyromegaly LUNGS:  Clear to auscultation bilaterally HEART:  RRR.  PMI not displaced or sustained,S1 and S2 within normal limits, no S3, no S4, no clicks, no rubs, no murmurs ABD:  Flat, positive bowel sounds normal in frequency in pitch, no bruits, no rebound, no guarding, no midline pulsatile mass, no hepatomegaly, no splenomegaly EXT:  2 plus pulses throughout, no edema, no cyanosis no clubbing SKIN:  No rashes no nodules NEURO:  Cranial nerves II through XII grossly intact, motor grossly intact throughout PSYCH:  Cognitively intact, oriented to person place and time   EKG:  EKG is not ordered today. The ekg ordered 04/14/20 demonstrates sinus rhythm.  Rate 79 bpm.  Non-specific ST abnormality  Echo 05/2018: 1. The left ventricle has normal systolic function with an ejection  fraction of 60-65%. The cavity size was normal. Left ventricular diastolic  Doppler parameters are consistent with pseudonormalization No evidence of  left ventricular regional wall  motion abnormalities.  2. The right ventricle has normal systolic function. The cavity was  normal. There is no increase in right ventricular wall thickness.  3. The mitral valve is normal in structure.  4. The tricuspid valve is normal in structure.  5. The aortic valve is normal in structure.  6. The aortic root and ascending aorta are normal in size and structure.  7. No evidence of left ventricular regional wall motion abnormalities.  8. Right atrial pressure is estimated at 3 mmHg.  9. The interatrial septum was not assessed.   3 Day Zio Monitor 05/2019:  Quality: Fair.  Baseline artifact. Predominant rhythm: sinus rhythm Average heart rate: 90 bpm Max heart rate: 146 bpm Min heart rate: 64 bpm Pauses >2.5 seconds: none  Patient did submit a symptom diary which correlated with  PVCs 16 beats NSVT.  Rate 113 bpm. Rare PACs and PVCs  Coronary CT-A 05/01/20: IMPRESSION: 1. Coronary calcium score of 0. This was 0 percentile for age and sex matched control. 2. Normal coronary origin with right dominance. 3. No evidence of CAD. 4.  Consider non-cardiac causes of chest pain.  Recent Labs: 02/13/2020: TSH 1.796 02/25/2020: Hemoglobin 11.7; Platelets 316 04/14/2020: ALT 20; BUN 6; Creatinine, Ser 0.78; Magnesium 2.3; Potassium 4.5; Sodium 138    Lipid Panel No results found for: CHOL, TRIG, HDL, CHOLHDL, VLDL, LDLCALC, LDLDIRECT    Wt Readings from Last 3 Encounters:  06/18/20 205 lb 6.4 oz (93.2 kg)  04/14/20 212 lb (96.2 kg)  02/25/20 195 lb (88.5 kg)      ASSESSMENT AND PLAN:  # Atypical chest pain:   Noncardiac chest pain.  Coronary CT-Kabbe was  normal 04/2020.  # Hypertension:  Blood pressure has been mostly controlled.  Continue working on diet and exercise.  We will give her metoprolol to take as needed for her palpitations.  # Palpitations:  # NSVT:  She did have a run of NSVT on her monitor.  She was not symptomatic from this.  We did give her metoprolol 25 mg to take as needed she is having a bad day.  She will prefer not to take it every day.  # VSD:   She reports that she had a small VSD.  There was a tiny VSD on coronary CT-A.  # Hepatic cysts:   She was noted to have hepatic cysts and an angioma on her chest CT 04/2018.  She will follow-up with her PCP as they did recommend abdominal ultrasound to better characterize hemangioma.  # Factor V Leiden:  # Prior PE:  Most recent work-up was negative for pulmonary embolism.  Seems that nothing is truly different than when she had her prior episode of pulmonary embolism.  Her hematologist recommended daily aspirin when she completed her anticoagulation therapy.  Continue aspirin.   Current medicines are reviewed at length with the patient today.  The patient does not have concerns regarding  medicines.  The following changes have been made:  no change  Labs/ tests ordered today include:   No orders of the defined types were placed in this encounter.    Disposition:   FU with Tiffany C. Duke Salvia, MD, [facility] in 1 year    Signed, Tiffany C. Duke Salvia, MD, [facility]  06/18/2020 12:56 PM    [facility]

## 2020-06-18 NOTE — Patient Instructions (Signed)
Medication Instructions:  TAKE METOPROLOL 25 MG EVERY 12 HOURS AS NEEDED FOR PALPITATIONS  *If you need a refill on your cardiac medications before your next appointment, please call your pharmacy*  Lab Work: NONE  Testing/Procedures: NONE   Follow-Up: At [facility], you and your health needs are our priority.  As part of our continuing mission to provide you with exceptional heart care, we have created designated Provider Care Teams.  These Care Teams include your primary Cardiologist (physician) and Advanced Practice Providers (APPs -  Physician Assistants and Nurse Practitioners) who all work together to provide you with the care you need, when you need it.  We recommend signing up for the patient portal called "MyChart".  Sign up information is provided on this After Visit Summary.  MyChart is used to connect with patients for Virtual Visits (Telemedicine).  Patients are able to view lab/test results, encounter notes, upcoming appointments, etc.  Non-urgent messages can be sent to your provider as well.   To learn more about what you can do with MyChart, go to [email or web address].    Your next appointment:   12 month(s)  The format for your next appointment:   In Person  Provider:   You may see [facility] or one of the following Advanced Practice Providers on your designated Care Team:    Marjie Skiff, PA-C  Edd Fabian, FNP

## 2020-07-14 DIAGNOSIS — F4323 Adjustment disorder with mixed anxiety and depressed mood: Secondary | ICD-10-CM | POA: Diagnosis not present

## 2020-07-20 DIAGNOSIS — F5081 Binge eating disorder: Secondary | ICD-10-CM | POA: Diagnosis not present

## 2020-07-20 DIAGNOSIS — F341 Dysthymic disorder: Secondary | ICD-10-CM | POA: Diagnosis not present

## 2020-08-04 DIAGNOSIS — F4323 Adjustment disorder with mixed anxiety and depressed mood: Secondary | ICD-10-CM | POA: Diagnosis not present

## 2020-08-11 DIAGNOSIS — F4323 Adjustment disorder with mixed anxiety and depressed mood: Secondary | ICD-10-CM | POA: Diagnosis not present

## 2020-08-17 ENCOUNTER — Telehealth: Payer: Self-pay

## 2020-08-17 DIAGNOSIS — F341 Dysthymic disorder: Secondary | ICD-10-CM | POA: Diagnosis not present

## 2020-08-17 DIAGNOSIS — F5081 Binge eating disorder: Secondary | ICD-10-CM | POA: Diagnosis not present

## 2020-08-17 DIAGNOSIS — F411 Generalized anxiety disorder: Secondary | ICD-10-CM | POA: Diagnosis not present

## 2020-08-17 DIAGNOSIS — Z006 Encounter for examination for normal comparison and control in clinical research program: Secondary | ICD-10-CM

## 2020-08-17 NOTE — Telephone Encounter (Signed)
Called patient for 90 day Identify phone call no answer, I left a voicemail stating the intent of the phone call and our call back number to be reached in our department. 

## 2020-08-18 DIAGNOSIS — F4323 Adjustment disorder with mixed anxiety and depressed mood: Secondary | ICD-10-CM | POA: Diagnosis not present

## 2020-09-08 DIAGNOSIS — F4323 Adjustment disorder with mixed anxiety and depressed mood: Secondary | ICD-10-CM | POA: Diagnosis not present

## 2020-09-15 DIAGNOSIS — F4323 Adjustment disorder with mixed anxiety and depressed mood: Secondary | ICD-10-CM | POA: Diagnosis not present

## 2020-09-23 DIAGNOSIS — F4323 Adjustment disorder with mixed anxiety and depressed mood: Secondary | ICD-10-CM | POA: Diagnosis not present

## 2020-10-13 DIAGNOSIS — F4323 Adjustment disorder with mixed anxiety and depressed mood: Secondary | ICD-10-CM | POA: Diagnosis not present

## 2020-11-06 DIAGNOSIS — Z139 Encounter for screening, unspecified: Secondary | ICD-10-CM | POA: Diagnosis not present

## 2020-11-06 DIAGNOSIS — M2550 Pain in unspecified joint: Secondary | ICD-10-CM | POA: Diagnosis not present

## 2020-11-06 DIAGNOSIS — E538 Deficiency of other specified B group vitamins: Secondary | ICD-10-CM | POA: Diagnosis not present

## 2020-11-06 DIAGNOSIS — Z1329 Encounter for screening for other suspected endocrine disorder: Secondary | ICD-10-CM | POA: Diagnosis not present

## 2020-11-06 DIAGNOSIS — E559 Vitamin D deficiency, unspecified: Secondary | ICD-10-CM | POA: Diagnosis not present

## 2020-11-06 DIAGNOSIS — R5383 Other fatigue: Secondary | ICD-10-CM | POA: Diagnosis not present

## 2020-11-06 DIAGNOSIS — K909 Intestinal malabsorption, unspecified: Secondary | ICD-10-CM | POA: Diagnosis not present

## 2020-11-09 DIAGNOSIS — F411 Generalized anxiety disorder: Secondary | ICD-10-CM | POA: Diagnosis not present

## 2020-11-09 DIAGNOSIS — F341 Dysthymic disorder: Secondary | ICD-10-CM | POA: Diagnosis not present

## 2020-11-12 DIAGNOSIS — F411 Generalized anxiety disorder: Secondary | ICD-10-CM | POA: Diagnosis not present

## 2020-11-25 DIAGNOSIS — F430 Acute stress reaction: Secondary | ICD-10-CM | POA: Diagnosis not present

## 2020-11-26 DIAGNOSIS — M25522 Pain in left elbow: Secondary | ICD-10-CM | POA: Diagnosis not present

## 2020-11-26 DIAGNOSIS — M25521 Pain in right elbow: Secondary | ICD-10-CM | POA: Diagnosis not present

## 2020-11-26 DIAGNOSIS — G8929 Other chronic pain: Secondary | ICD-10-CM | POA: Diagnosis not present

## 2020-11-26 DIAGNOSIS — M25552 Pain in left hip: Secondary | ICD-10-CM | POA: Diagnosis not present

## 2020-11-26 DIAGNOSIS — M25551 Pain in right hip: Secondary | ICD-10-CM | POA: Diagnosis not present

## 2020-12-10 DIAGNOSIS — F411 Generalized anxiety disorder: Secondary | ICD-10-CM | POA: Diagnosis not present

## 2020-12-16 DIAGNOSIS — F5081 Binge eating disorder: Secondary | ICD-10-CM | POA: Diagnosis not present

## 2020-12-16 DIAGNOSIS — F411 Generalized anxiety disorder: Secondary | ICD-10-CM | POA: Diagnosis not present

## 2020-12-16 DIAGNOSIS — F341 Dysthymic disorder: Secondary | ICD-10-CM | POA: Diagnosis not present

## 2020-12-23 DIAGNOSIS — M25572 Pain in left ankle and joints of left foot: Secondary | ICD-10-CM | POA: Diagnosis not present

## 2021-01-01 DIAGNOSIS — K909 Intestinal malabsorption, unspecified: Secondary | ICD-10-CM | POA: Diagnosis not present

## 2021-01-01 DIAGNOSIS — M2550 Pain in unspecified joint: Secondary | ICD-10-CM | POA: Diagnosis not present

## 2021-01-01 DIAGNOSIS — R5383 Other fatigue: Secondary | ICD-10-CM | POA: Diagnosis not present

## 2021-01-01 DIAGNOSIS — L659 Nonscarring hair loss, unspecified: Secondary | ICD-10-CM | POA: Diagnosis not present

## 2021-01-06 DIAGNOSIS — M25572 Pain in left ankle and joints of left foot: Secondary | ICD-10-CM | POA: Diagnosis not present

## 2021-03-03 DIAGNOSIS — F4323 Adjustment disorder with mixed anxiety and depressed mood: Secondary | ICD-10-CM | POA: Diagnosis not present

## 2021-03-23 DIAGNOSIS — F341 Dysthymic disorder: Secondary | ICD-10-CM | POA: Diagnosis not present

## 2021-03-23 DIAGNOSIS — F5081 Binge eating disorder: Secondary | ICD-10-CM | POA: Diagnosis not present

## 2021-04-29 DIAGNOSIS — F411 Generalized anxiety disorder: Secondary | ICD-10-CM | POA: Diagnosis not present

## 2021-04-29 DIAGNOSIS — F5081 Binge eating disorder: Secondary | ICD-10-CM | POA: Diagnosis not present

## 2021-04-29 DIAGNOSIS — F341 Dysthymic disorder: Secondary | ICD-10-CM | POA: Diagnosis not present

## 2021-05-13 DIAGNOSIS — F4323 Adjustment disorder with mixed anxiety and depressed mood: Secondary | ICD-10-CM | POA: Diagnosis not present

## 2021-05-25 ENCOUNTER — Encounter: Payer: Self-pay | Admitting: Advanced Practice Midwife

## 2021-05-25 ENCOUNTER — Other Ambulatory Visit (HOSPITAL_COMMUNITY)
Admission: RE | Admit: 2021-05-25 | Discharge: 2021-05-25 | Disposition: A | Discharge status: Home / Self Care | Payer: BC Managed Care – PPO | Source: Ambulatory Visit | Attending: Advanced Practice Midwife | Admitting: Advanced Practice Midwife

## 2021-05-25 ENCOUNTER — Ambulatory Visit (INDEPENDENT_AMBULATORY_CARE_PROVIDER_SITE_OTHER): Payer: BC Managed Care – PPO | Admitting: Advanced Practice Midwife

## 2021-05-25 ENCOUNTER — Other Ambulatory Visit: Payer: Self-pay

## 2021-05-25 VITALS — BP 140/78 | HR 90 | Ht 68.0 in | Wt 226.0 lb

## 2021-05-25 DIAGNOSIS — R87820 Cervical low risk human papillomavirus (HPV) DNA test positive: Secondary | ICD-10-CM

## 2021-05-25 DIAGNOSIS — Z01419 Encounter for gynecological examination (general) (routine) without abnormal findings: Secondary | ICD-10-CM | POA: Insufficient documentation

## 2021-05-25 DIAGNOSIS — Z30432 Encounter for removal of intrauterine contraceptive device: Secondary | ICD-10-CM

## 2021-05-25 DIAGNOSIS — Z1231 Encounter for screening mammogram for malignant neoplasm of breast: Secondary | ICD-10-CM

## 2021-05-25 DIAGNOSIS — R030 Elevated blood-pressure reading, without diagnosis of hypertension: Secondary | ICD-10-CM | POA: Diagnosis not present

## 2021-05-25 NOTE — Progress Notes (Signed)
Subjective:     Nancy Patel is a 46 y.o. female here at [facility] for a routine exam.  Current complaints: brittle thinning hair with IUD. She is not currently sexually active and is interested in having it removed.  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 She has no hx of HTN but reports she has gained 20 lbs in the last year due to stress.  Her mother is in the hospital right now and she is working on discharge to a rehab facility.  Personal health questionnaire reviewed: yes.  Do you have a primary care provider? yes Do you feel safe at home? yes    Health Maintenance Due  Topic Date Due   Hepatitis C Screening  Never done   PAP SMEAR-Modifier  Never done   COVID-19 Vaccine (3 - Booster for Pfizer series) 06/07/2019   COLONOSCOPY (Pts 45-25yrs Insurance coverage will need to be confirmed)  Never done   TETANUS/TDAP  08/16/2020   INFLUENZA VACCINE  11/02/2020     Risk factors for chronic health problems: Smoking: Never Alchohol/how much: Yes, 2 drinks per week Pt BMI: Body mass index is 34.36 kg/m.   Gynecologic History No LMP recorded. (Menstrual status: IUD). Contraception: IUD Last Pap: 2022. Results were: normal cytology with positive HPV Last mammogram: 2022. Results were: normal  Obstetric History OB History  Gravida Para Term Preterm AB Living  1       1    SAB IAB Ectopic Multiple Live Births    1          # Outcome Date GA Lbr Len/2nd Weight Sex Delivery Anes PTL Lv  1 IAB              The following portions of the patient's history were reviewed and updated as appropriate: allergies, current medications, past family history, past medical history, past social history, past surgical history, and problem list.  Review of Systems Pertinent items noted in HPI and remainder of comprehensive ROS otherwise negative.    Objective:   BP 140/78    Pulse 90    Ht 5\' 8"  (1.727 m)    Wt  226 lb (102.5 kg)    BMI 34.36 kg/m  VS reviewed, nursing note reviewed,  Constitutional: well developed, well nourished, no distress HEENT: normocephalic CV: normal rate Pulm/chest wall: normal effort Breast Exam: exam performed: right breast normal without mass, skin or nipple changes or axillary nodes, left breast normal without mass, skin or nipple changes or axillary nodes Abdomen: soft Neuro: alert and oriented x 3 Skin: warm, dry Psych: affect normal Pelvic exam: Performed: Cervix pink, visually closed, without lesion, scant white creamy discharge, vaginal walls and external genitalia normal Bimanual exam: Cervix 0/long/high, firm, anterior, neg CMT, uterus nontender, nonenlarged, adnexa without tenderness, enlargement, or mass      IUD Removal  Patient was in the dorsal lithotomy position, normal external genitalia was noted.  A speculum was placed in the patient's vagina, normal discharge was noted, no lesions. The multiparous cervix was visualized, no lesions, no abnormal discharge.  The strings of the IUD were grasped and pulled using ring forceps. The IUD was removed in its entirety. Patient tolerated the procedure well.    Patient will use abstinence for contraception and will contact the office if she desires other methods in the future.  Routine preventative health maintenance measures emphasized.  Assessment/Plan:   1. Well woman exam --Overall doing well, no menses with IUD but changes to hair (weak/brittle) and wants to see if removing IUD helps. --Pap last year (see care everywhere) with normal cytology but HPV positive --Repeat Pap today  - Cytology - PAP( [place])  2. Breast cancer screening by mammogram  - MM DIGITAL SCREENING BILATERAL; Future  3. Encounter for IUD removal --IUD removed today without difficulty.  See procedure note above.  4. Elevated blood pressure reading without diagnosis of hypertension --Discussed pt recent stress with her  mother in poor health.  Recommended walking 2-3 times per week for stress relief and heart health.   --Weight loss is important but blood pressure and heart health may be improved with small changes since she may not have time for long workouts in her current role as caretaker. --Pt to f/u with PCP in a few months    Follow up in: 1  year  or as needed.   Sharen Counter, CNM 8:51 AM

## 2021-05-27 ENCOUNTER — Other Ambulatory Visit (HOSPITAL_BASED_OUTPATIENT_CLINIC_OR_DEPARTMENT_OTHER): Payer: Self-pay | Admitting: Advanced Practice Midwife

## 2021-05-27 DIAGNOSIS — Z1231 Encounter for screening mammogram for malignant neoplasm of breast: Secondary | ICD-10-CM

## 2021-05-27 LAB — CYTOLOGY - PAP
Comment: NEGATIVE
Diagnosis: NEGATIVE
High risk HPV: POSITIVE — AB

## 2021-05-28 ENCOUNTER — Ambulatory Visit (HOSPITAL_BASED_OUTPATIENT_CLINIC_OR_DEPARTMENT_OTHER)
Admission: RE | Admit: 2021-05-28 | Discharge: 2021-05-28 | Disposition: A | Discharge status: Home / Self Care | Payer: BC Managed Care – PPO | Source: Ambulatory Visit | Attending: Advanced Practice Midwife | Admitting: Advanced Practice Midwife

## 2021-05-28 ENCOUNTER — Encounter (HOSPITAL_BASED_OUTPATIENT_CLINIC_OR_DEPARTMENT_OTHER): Payer: Self-pay

## 2021-05-28 ENCOUNTER — Other Ambulatory Visit: Payer: Self-pay

## 2021-05-28 DIAGNOSIS — Z13220 Encounter for screening for lipoid disorders: Secondary | ICD-10-CM | POA: Diagnosis not present

## 2021-05-28 DIAGNOSIS — Z0001 Encounter for general adult medical examination with abnormal findings: Secondary | ICD-10-CM | POA: Diagnosis not present

## 2021-05-28 DIAGNOSIS — F3341 Major depressive disorder, recurrent, in partial remission: Secondary | ICD-10-CM | POA: Diagnosis not present

## 2021-05-28 DIAGNOSIS — Z0000 Encounter for general adult medical examination without abnormal findings: Secondary | ICD-10-CM | POA: Diagnosis not present

## 2021-05-28 DIAGNOSIS — E669 Obesity, unspecified: Secondary | ICD-10-CM | POA: Diagnosis not present

## 2021-05-28 DIAGNOSIS — F419 Anxiety disorder, unspecified: Secondary | ICD-10-CM | POA: Diagnosis not present

## 2021-05-28 DIAGNOSIS — D6851 Activated protein C resistance: Secondary | ICD-10-CM | POA: Diagnosis not present

## 2021-05-28 DIAGNOSIS — Z7901 Long term (current) use of anticoagulants: Secondary | ICD-10-CM | POA: Diagnosis not present

## 2021-05-28 DIAGNOSIS — Z6834 Body mass index (BMI) 34.0-34.9, adult: Secondary | ICD-10-CM | POA: Diagnosis not present

## 2021-05-28 DIAGNOSIS — D509 Iron deficiency anemia, unspecified: Secondary | ICD-10-CM | POA: Diagnosis not present

## 2021-05-28 DIAGNOSIS — Z79899 Other long term (current) drug therapy: Secondary | ICD-10-CM | POA: Diagnosis not present

## 2021-05-28 DIAGNOSIS — Z13228 Encounter for screening for other metabolic disorders: Secondary | ICD-10-CM | POA: Diagnosis not present

## 2021-05-28 DIAGNOSIS — Z23 Encounter for immunization: Secondary | ICD-10-CM | POA: Diagnosis not present

## 2021-05-28 DIAGNOSIS — Z1231 Encounter for screening mammogram for malignant neoplasm of breast: Secondary | ICD-10-CM | POA: Insufficient documentation

## 2021-05-28 DIAGNOSIS — R319 Hematuria, unspecified: Secondary | ICD-10-CM | POA: Diagnosis not present

## 2021-05-28 DIAGNOSIS — Z7982 Long term (current) use of aspirin: Secondary | ICD-10-CM | POA: Diagnosis not present

## 2021-06-01 DIAGNOSIS — F4323 Adjustment disorder with mixed anxiety and depressed mood: Secondary | ICD-10-CM | POA: Diagnosis not present

## 2021-06-16 DIAGNOSIS — F5081 Binge eating disorder: Secondary | ICD-10-CM | POA: Diagnosis not present

## 2021-06-16 DIAGNOSIS — F411 Generalized anxiety disorder: Secondary | ICD-10-CM | POA: Diagnosis not present

## 2021-06-16 DIAGNOSIS — F341 Dysthymic disorder: Secondary | ICD-10-CM | POA: Diagnosis not present

## 2021-06-18 NOTE — Progress Notes (Deleted)
? ? ?  GYNECOLOGY OFFICE COLPOSCOPY PROCEDURE NOTE ? ?46 y.o. G1P0010 here for colposcopy for  normal pap and HPV pos pap smear on 05/2021.  ? ?Pregnancy test:  {Blank single:19197::"positive","negative","not indicated"} ?Gardasil:  {Blank single:19197::"completed","not yet had, declines","not yet had, accepts"}. Discussed role for HPV in cervical dysplasia, need for surveillance. ? ?Informed consent and review of risks, benefit and alternatives performed. Written consent given.  ? ?Speculum inserted into patient's vagina assuring full view of cervix and vaginal walls. 3 swabs of vinegar solution applied to the cervix and vaginal walls and colposcope was used to observe both the cervix and vaginal walls.  ? ?Colposcopy adequate? {yes/no:20286} ? {Findings; colposcopy:728}; corresponding biopsies obtained.  ECC collected. ? ?All specimens were labeled and sent to pathology. ? ?Monsel's applied to biopsy sites for good hemostasis and speculum removed.  ?Pt tolerated well with minimal pain and bleeding.  ? ?Patient was given post procedure instructions.  Will follow up pathology and manage accordingly; patient will be contacted with results and recommendations.  Routine preventative health maintenance measures emphasized. ? ? ? ?Milas Hock, MD, FACOG ?Obstetrician Heritage manager, Faculty Practice ?Center for [employer], [facility] ? ? ? ? ? ? ?

## 2021-06-24 ENCOUNTER — Encounter: Payer: BC Managed Care – PPO | Admitting: Obstetrics and Gynecology

## 2021-06-24 ENCOUNTER — Ambulatory Visit (HOSPITAL_BASED_OUTPATIENT_CLINIC_OR_DEPARTMENT_OTHER): Payer: BC Managed Care – PPO | Admitting: Family

## 2021-07-02 DIAGNOSIS — F411 Generalized anxiety disorder: Secondary | ICD-10-CM | POA: Diagnosis not present

## 2021-07-02 DIAGNOSIS — F5081 Binge eating disorder: Secondary | ICD-10-CM | POA: Diagnosis not present

## 2021-07-02 DIAGNOSIS — F341 Dysthymic disorder: Secondary | ICD-10-CM | POA: Diagnosis not present

## 2021-07-22 ENCOUNTER — Encounter: Payer: BC Managed Care – PPO | Admitting: Obstetrics & Gynecology

## 2021-08-05 ENCOUNTER — Encounter: Payer: Self-pay | Admitting: Gastroenterology

## 2021-08-13 DIAGNOSIS — F4320 Adjustment disorder, unspecified: Secondary | ICD-10-CM | POA: Diagnosis not present

## 2021-08-13 DIAGNOSIS — F5081 Binge eating disorder: Secondary | ICD-10-CM | POA: Diagnosis not present

## 2021-08-13 DIAGNOSIS — F341 Dysthymic disorder: Secondary | ICD-10-CM | POA: Diagnosis not present

## 2021-08-17 DIAGNOSIS — F4323 Adjustment disorder with mixed anxiety and depressed mood: Secondary | ICD-10-CM | POA: Diagnosis not present

## 2021-08-17 NOTE — Progress Notes (Signed)
    GYNECOLOGY OFFICE COLPOSCOPY PROCEDURE NOTE  46 y.o. G1P0010 here for colposcopy for  normal pap but HPV positive  05/2021 04/2020: Pap normal, HPV pos (non 16/18)  Pregnancy test:  negative Gardasil:   Has not yet had, >45 . Discussed role for HPV in cervical dysplasia, need for surveillance.  Informed consent and review of risks, benefit and alternatives performed. Written consent given.   Speculum inserted into patient's vagina assuring full view of cervix and vaginal walls. 3 swabs of vinegar solution applied to the cervix and vaginal walls and colposcope was used to observe both the cervix and vaginal walls.   Colposcopy adequate? Yes  no visible lesions, no mosaicism, no punctation, and no abnormal vasculature;   ECC collected.  All specimens were labeled and sent to pathology.  Speculum removed. Normal cervical consistency on bimanual.  Pt tolerated well with minimal pain and bleeding.   Patient was given post procedure instructions.  Will follow up pathology and manage accordingly; patient will be contacted with results and recommendations.  Routine preventative health maintenance measures emphasized.    Radene Gunning, MD, [place] for [facility], [place]

## 2021-08-19 ENCOUNTER — Other Ambulatory Visit (HOSPITAL_COMMUNITY)
Admission: RE | Admit: 2021-08-19 | Discharge: 2021-08-19 | Disposition: A | Discharge status: Home / Self Care | Payer: BC Managed Care – PPO | Source: Ambulatory Visit | Attending: Obstetrics and Gynecology | Admitting: Obstetrics and Gynecology

## 2021-08-19 ENCOUNTER — Ambulatory Visit (INDEPENDENT_AMBULATORY_CARE_PROVIDER_SITE_OTHER): Payer: BC Managed Care – PPO | Admitting: Obstetrics and Gynecology

## 2021-08-19 ENCOUNTER — Encounter: Payer: Self-pay | Admitting: Obstetrics and Gynecology

## 2021-08-19 VITALS — BP 143/93 | HR 85 | Ht 68.0 in | Wt 221.0 lb

## 2021-08-19 DIAGNOSIS — R87810 Cervical high risk human papillomavirus (HPV) DNA test positive: Secondary | ICD-10-CM

## 2021-08-19 LAB — POCT URINE PREGNANCY: Preg Test, Ur: NEGATIVE

## 2021-08-20 ENCOUNTER — Ambulatory Visit (AMBULATORY_SURGERY_CENTER): Payer: Self-pay | Admitting: *Deleted

## 2021-08-20 VITALS — Ht 68.0 in | Wt 223.0 lb

## 2021-08-20 DIAGNOSIS — Z1211 Encounter for screening for malignant neoplasm of colon: Secondary | ICD-10-CM

## 2021-08-20 LAB — SURGICAL PATHOLOGY

## 2021-08-20 MED ORDER — NA SULFATE-K SULFATE-MG SULF 17.5-3.13-1.6 GM/177ML PO SOLN: 1.0000 | Freq: Once | ORAL | 0 refills | Status: AC

## 2021-08-20 NOTE — Progress Notes (Signed)

## 2021-09-01 DIAGNOSIS — F4323 Adjustment disorder with mixed anxiety and depressed mood: Secondary | ICD-10-CM | POA: Diagnosis not present

## 2021-09-14 ENCOUNTER — Encounter: Payer: Self-pay | Admitting: Gastroenterology

## 2021-09-14 DIAGNOSIS — F4323 Adjustment disorder with mixed anxiety and depressed mood: Secondary | ICD-10-CM | POA: Diagnosis not present

## 2021-09-15 DIAGNOSIS — F341 Dysthymic disorder: Secondary | ICD-10-CM | POA: Diagnosis not present

## 2021-09-15 DIAGNOSIS — F4320 Adjustment disorder, unspecified: Secondary | ICD-10-CM | POA: Diagnosis not present

## 2021-09-15 DIAGNOSIS — F411 Generalized anxiety disorder: Secondary | ICD-10-CM | POA: Diagnosis not present

## 2021-09-15 DIAGNOSIS — F5081 Binge eating disorder: Secondary | ICD-10-CM | POA: Diagnosis not present

## 2021-09-17 ENCOUNTER — Encounter: Payer: Self-pay | Admitting: Gastroenterology

## 2021-09-17 ENCOUNTER — Ambulatory Visit (AMBULATORY_SURGERY_CENTER): Payer: BC Managed Care – PPO | Admitting: Gastroenterology

## 2021-09-17 VITALS — BP 136/79 | HR 75 | Temp 98.2°F | Resp 20 | Ht 68.0 in | Wt 226.0 lb

## 2021-09-17 DIAGNOSIS — Z1211 Encounter for screening for malignant neoplasm of colon: Secondary | ICD-10-CM

## 2021-09-17 IMAGING — CT CT ANGIO CHEST
2 of 6 series · 18 of 36 positions shown · IV contrast (omnipaque)
Comparison: 05/15/2018

CLINICAL DATA: Chest tightness, shortness of breath, elevated
D-dimer, history of pulmonary embolus

EXAM:
CT ANGIOGRAPHY CHEST WITH CONTRAST
TECHNIQUE: Multidetector CT imaging of the chest was performed using the
standard protocol during bolus administration of intravenous
contrast. Multiplanar CT image reconstructions and MIPs were
obtained to evaluate the vascular anatomy.
CONTRAST:  100mL OMNIPAQUE IOHEXOL 350 MG/ML SOLN

[Series 5: thins · axial · 0.69mm/px · z∈[+1348,+1588]mm · 17 of 271 slices shown]
[im 16/271  lung]
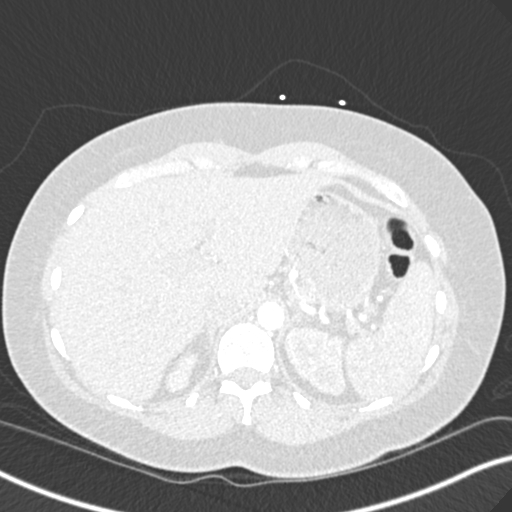
[im 31/271  mediastinal]
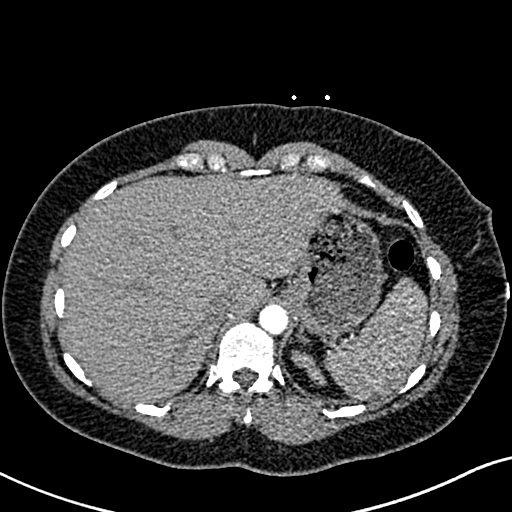
[im 46/271  lung]
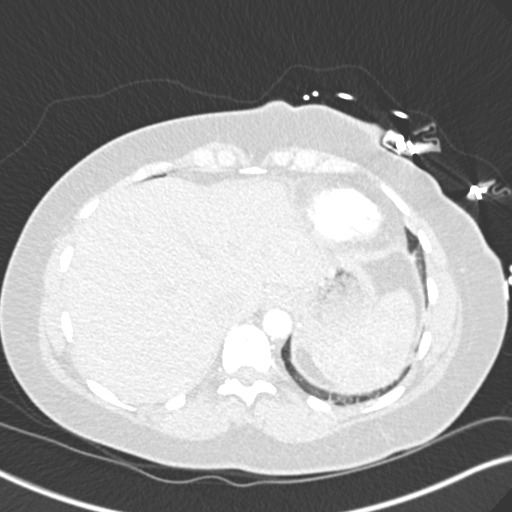
[im 61/271  mediastinal]
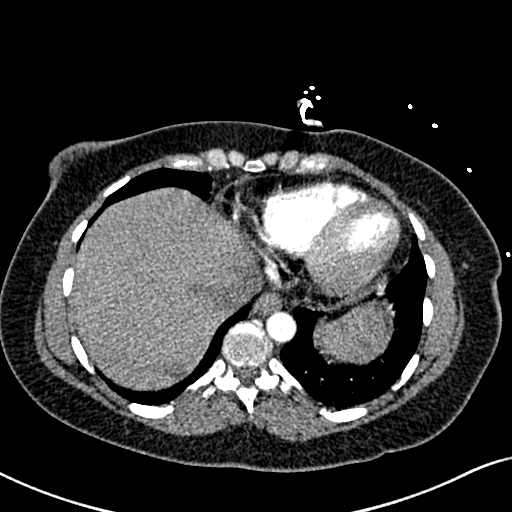
[im 76/271  lung]
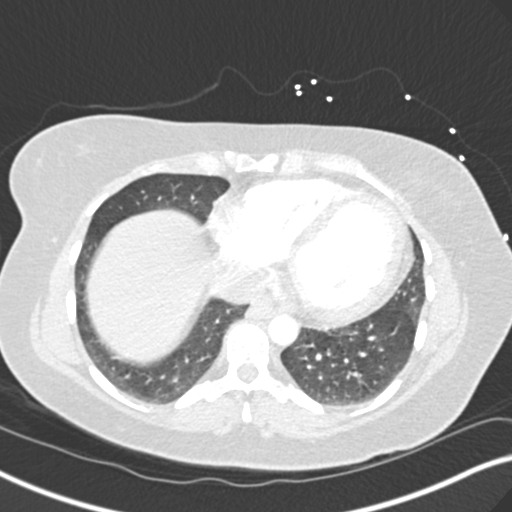
[im 91/271  mediastinal]
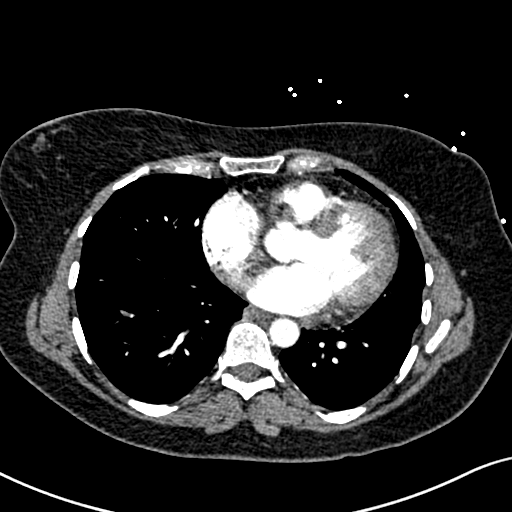
[im 106/271  lung]
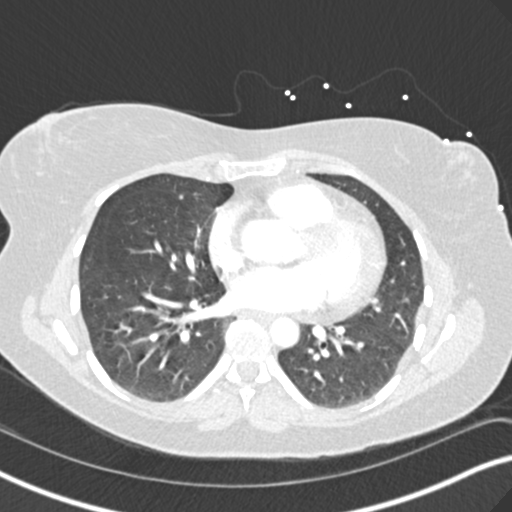
[im 121/271  mediastinal]
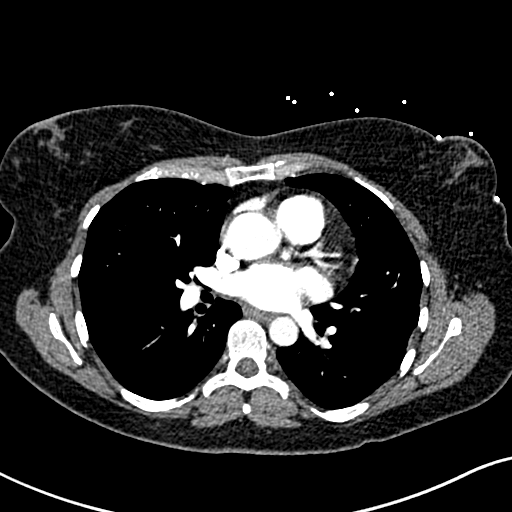
[im 136/271  lung]
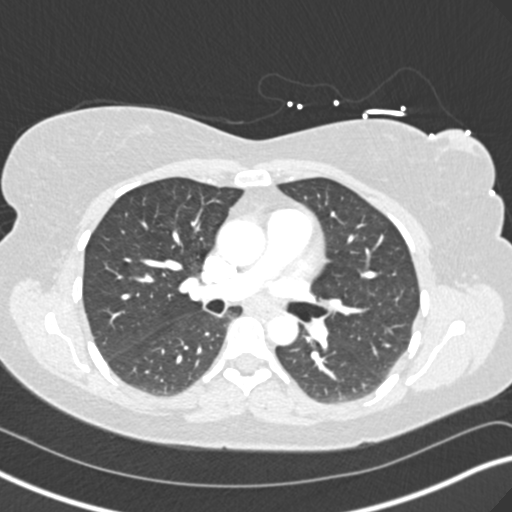
[im 151/271  mediastinal]
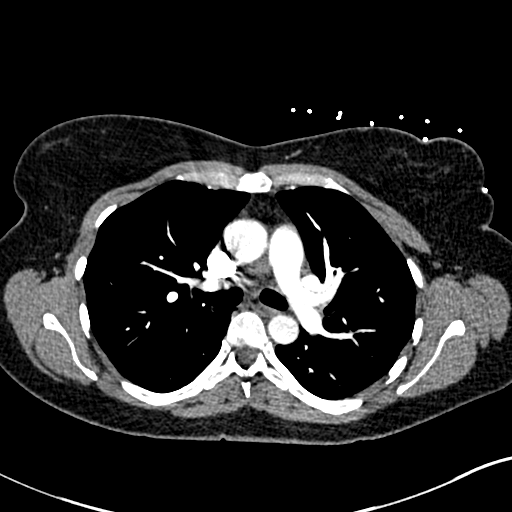
[im 166/271  lung]
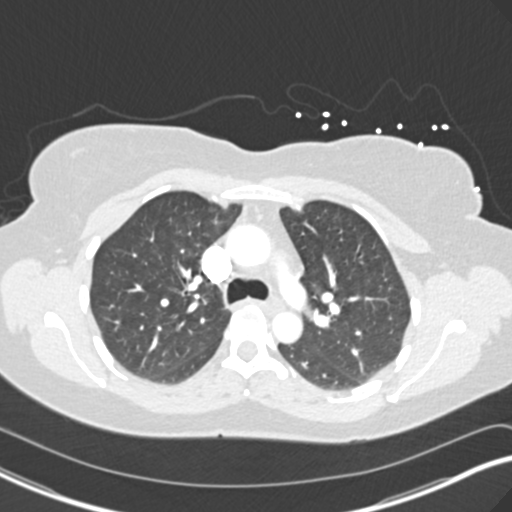
[im 181/271  mediastinal]
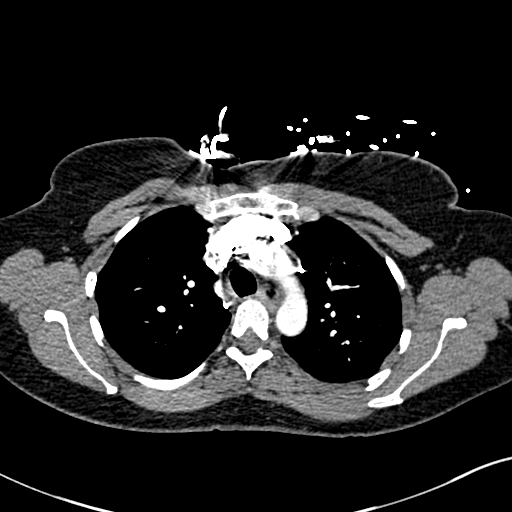
[im 196/271  lung]
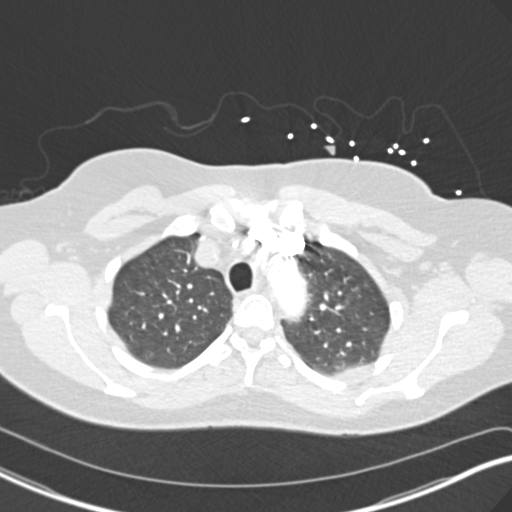
[im 211/271  mediastinal]
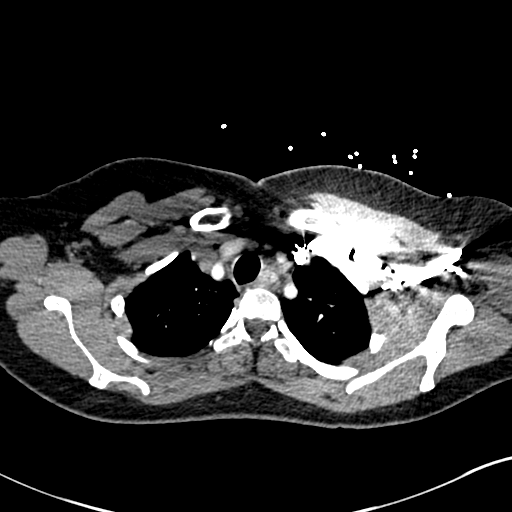
[im 226/271  lung]
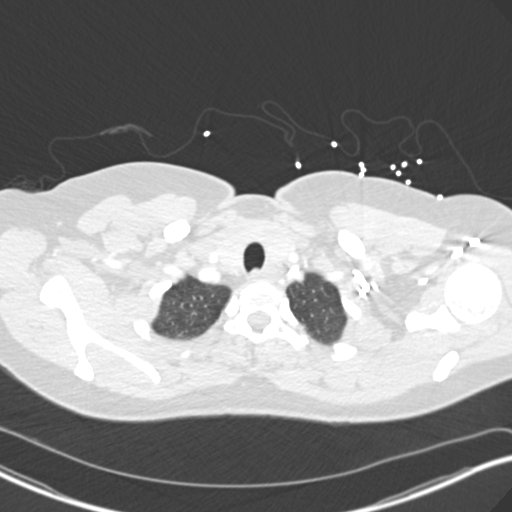
[im 241/271  mediastinal]
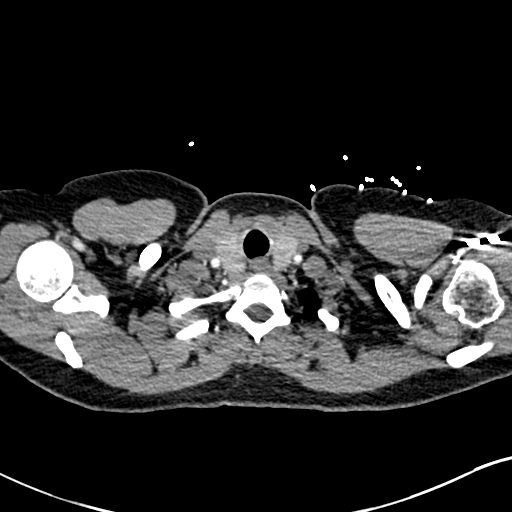
[im 256/271  lung]
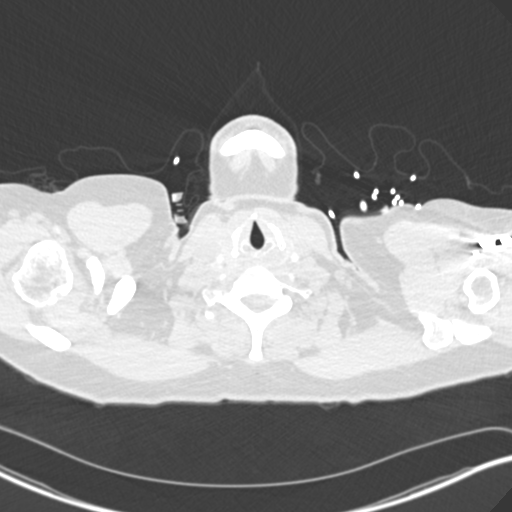

[Series 7: coronal mpr · coronal · 0.54mm/px · 1 of 125 slices shown]
[im 63/125  mediastinal]
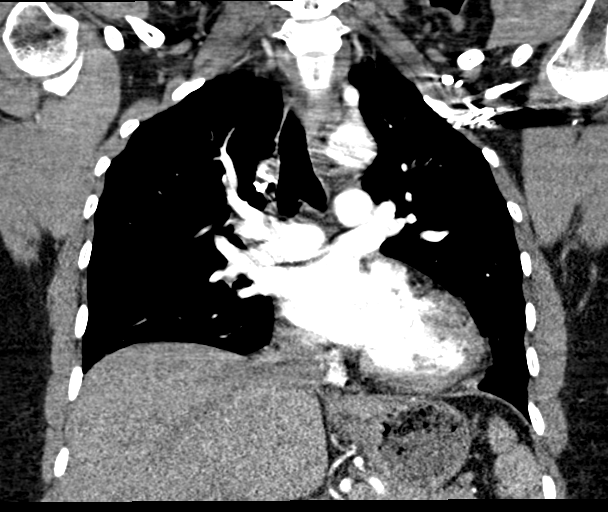

[18 of 36 positions shown; findings below may reference images not displayed]

FINDINGS: Cardiovascular: This is a technically adequate evaluation of the
pulmonary vasculature. No filling defects or pulmonary emboli.

The heart is mildly enlarged without pericardial effusion. Normal
appearance of the thoracic aorta.

Mediastinum/Nodes: No enlarged mediastinal, hilar, or axillary lymph
nodes. Thyroid gland, trachea, and esophagus demonstrate no
significant findings.

Lungs/Pleura: No airspace disease, effusion, or pneumothorax.
Central airways are patent.

Upper Abdomen: No acute abnormality.

Musculoskeletal: No acute or destructive bony lesions. Reconstructed
images demonstrate no additional findings.

Review of the MIP images confirms the above findings.
IMPRESSION: 1. No evidence of pulmonary embolus. No acute intrathoracic process.

## 2021-09-17 MED ORDER — SODIUM CHLORIDE 0.9 % IV SOLN: 500.0000 mL | Freq: Once | INTRAVENOUS | Status: DC

## 2021-09-17 NOTE — Progress Notes (Signed)
Pt's states no medical or surgical changes since previsit or office visit. 

## 2021-09-17 NOTE — Progress Notes (Signed)
History and Physical:  This patient presents for endoscopic testing for: Encounter Diagnosis  Name Primary?   Special screening for malignant neoplasms, colon Yes    Average risk - first screening exam. Patient denies chronic abdominal pain, rectal bleeding, constipation or diarrhea.   Patient is otherwise without complaints or active issues today.   Past Medical History: 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   Hepatic cyst 04/14/2020   Medical history non-contributory    NSVT (nonsustained ventricular tachycardia) (HCC) 06/18/2020   Palpitations 04/14/2020     Past Surgical History: Past Surgical History:  Procedure Laterality Date   GANGLION CYST EXCISION Left 2003   LAPAROSCOPIC APPENDECTOMY N/A 05/03/2018   Procedure: APPENDECTOMY LAPAROSCOPIC;  Surgeon: Darnell Level, MD;  Location: [facility];  Service: General;  Laterality: N/A;   WISDOM TOOTH EXTRACTION  2005   WRIST FRACTURE SURGERY Right 2010    Allergies: No Known Allergies  Outpatient Meds: Current Outpatient Medications  Medication Sig Dispense Refill   aspirin EC 81 MG tablet Take 81 mg by mouth daily. Swallow whole.     gabapentin (NEURONTIN) 300 MG capsule TAKE 1 TO 3 CAPSULES DAILY     mirtazapine (REMERON) 15 MG tablet Take 15 mg by mouth at bedtime.     tretinoin (RETIN-A) 0.1 % cream Apply 1 application topically at bedtime.     Cholecalciferol (VITAMIN D3) 1.25 MG (50000 UT) CAPS Take 1 capsule by mouth once a week.     ferrous sulfate 325 (65 FE) MG tablet Take by mouth.     metoprolol tartrate (LOPRESSOR) 25 MG tablet EVERY 12 HOURS AS NEEDED FOR PALPITATIONS 90 tablet 3   ondansetron (ZOFRAN) 4 MG tablet Take 4 mg by mouth every 8 (eight) hours as needed. (Patient not taking:  Reported on 09/17/2021)     pramipexole (MIRAPEX) 0.25 MG tablet Take by mouth. (Patient not taking: Reported on 09/17/2021)     prazosin (MINIPRESS) 1 MG capsule Take 1 mg by mouth at bedtime. (Patient not taking: Reported on 09/17/2021)     Current Facility-Administered Medications  Medication Dose Route Frequency Provider Last Rate Last Admin   0.9 %  sodium chloride infusion  500 mL Intravenous Once Sherrilyn Rist, MD          ___________________________________________________________________ Objective   Exam:  BP (!) 148/92   Pulse 75   Temp 98.2 F (36.8 C)   Ht 5\' 8"  (1.727 m)   Wt 226 lb (102.5 kg)   LMP 09/03/2021 (Approximate)   SpO2 97%   BMI 34.36 kg/m   CV: RRR without murmur, S1/S2 Resp: clear to auscultation bilaterally, normal RR and effort noted GI: soft, no tenderness, with active bowel sounds.   Assessment: Encounter Diagnosis  Name Primary?   Special screening for malignant neoplasms, colon Yes     Plan: Colonoscopy  The benefits and risks of the planned procedure were described in detail with the patient or (when appropriate) their health care proxy.  Risks were outlined as including, but not limited to, bleeding, infection, perforation, adverse medication reaction leading to cardiac or pulmonary decompensation, pancreatitis (if ERCP).  The limitation of incomplete mucosal visualization was also discussed.  No guarantees or warranties were given.    The patient is appropriate for an endoscopic procedure in the ambulatory setting.   - Wilfrid Lund, MD

## 2021-09-17 NOTE — Progress Notes (Signed)
No problems noted in the recovery room. maw   , Report andPt was HIPAA and AVS was sealed in an envelope and given to pt on discharge. maw

## 2021-09-17 NOTE — Op Note (Signed)
[place] Patient Name: Nancy Patel Procedure Date: 09/17/2021 10:58 AM MRN: [id number] Endoscopist: McRoberts. Loletha Carrow , MD Age: 46 Referring MD:  Date of Birth: 29-Jun-1975 Gender: Female Account #: [id number] Procedure:                Colonoscopy Indications:              Screening for colorectal malignant neoplasm, This                            is the patient's first colonoscopy Medicines:                Monitored Anesthesia Care Procedure:                Pre-Anesthesia Assessment:                           - Prior to the procedure, a History and Physical                            was performed, and patient medications and                            allergies were reviewed. The patient's tolerance of                            previous anesthesia was also reviewed. The risks                            and benefits of the procedure and the sedation                            options and risks were discussed with the patient.                            All questions were answered, and informed consent                            was obtained. Prior Anticoagulants: The patient has                            taken no previous anticoagulant or antiplatelet                            agents. ASA Grade Assessment: II - A patient with                            mild systemic disease. After reviewing the risks                            and benefits, the patient was deemed in                            satisfactory condition to undergo the procedure.  After obtaining informed consent, the colonoscope                            was passed under direct vision. Throughout the                            procedure, the patient's blood pressure, pulse, and                            oxygen saturations were monitored continuously. The                            Olympus CF-HQ190L ([id number]) Colonoscope was                            introduced through the anus  and advanced to the the                            cecum, identified by appendiceal orifice and                            ileocecal valve. The colonoscopy was performed                            without difficulty. The patient tolerated the                            procedure well. The quality of the bowel                            preparation was good. The ileocecal valve,                            appendiceal orifice, and rectum were photographed.                            The bowel preparation used was SUPREP. Scope In: 11:08:10 AM Scope Out: 11:20:57 AM Scope Withdrawal Time: 0 hours 9 minutes 35 seconds  Total Procedure Duration: 0 hours 12 minutes 47 seconds  Findings:                 The perianal and digital rectal examinations were                            normal.                           Repeat examination of right colon under NBI                            performed.                           The sigmoid colon was redundant.  There is no endoscopic evidence of polyps in the                            entire colon.                           The exam was otherwise without abnormality on                            direct and retroflexion views. Complications:            No immediate complications. Estimated Blood Loss:     Estimated blood loss: none. Impression:               - Redundant colon.                           - The examination was otherwise normal on direct                            and retroflexion views.                           - No specimens collected. Recommendation:           - Patient has a contact number available for                            emergencies. The signs and symptoms of potential                            delayed complications were discussed with the                            patient. Return to normal activities tomorrow.                            Written discharge instructions were provided to the                             patient.                           - Resume previous diet.                           - Continue present medications.                           - Repeat colonoscopy in 10 years for screening                            purposes. Ruthy Forry L. Myrtie Neither, MD 09/17/2021 11:23:15 AM This report has been signed electronically.

## 2021-09-17 NOTE — Patient Instructions (Addendum)
You may resume your current medications today. Repeat screening colonoscopy for screening purposes in 10 years. Please call if any questions or concerns.     YOU HAD AN ENDOSCOPIC PROCEDURE TODAY AT THE [facility]:   Refer to the procedure report that was given to you for any specific questions about what was found during the examination.  If the procedure report does not answer your questions, please call your gastroenterologist to clarify.  If you requested that your care partner not be given the details of your procedure findings, then the procedure report has been included in a sealed envelope for you to review at your convenience later.  YOU SHOULD EXPECT: Some feelings of bloating in the abdomen. Passage of more gas than usual.  Walking can help get rid of the air that was put into your GI tract during the procedure and reduce the bloating. If you had a lower endoscopy (such as a colonoscopy or flexible sigmoidoscopy) you may notice spotting of blood in your stool or on the toilet paper. If you underwent a bowel prep for your procedure, you may not have a normal bowel movement for a few days.  Please Note:  You might notice some irritation and congestion in your nose or some drainage.  This is from the oxygen used during your procedure.  There is no need for concern and it should clear up in a day or so.  SYMPTOMS TO REPORT IMMEDIATELY:  Following lower endoscopy (colonoscopy or flexible sigmoidoscopy):  Excessive amounts of blood in the stool  Significant tenderness or worsening of abdominal pains  Swelling of the abdomen that is new, acute  Fever of 100F or higher   For urgent or emergent issues, a gastroenterologist can be reached at any hour by calling [phone]. Do not use MyChart messaging for urgent concerns.    DIET:  We do recommend a small meal at first, but then you may proceed to your regular diet.  Drink plenty of fluids but you should avoid alcoholic  beverages for 24 hours.  ACTIVITY:  You should plan to take it easy for the rest of today and you should NOT DRIVE or use heavy machinery until tomorrow (because of the sedation medicines used during the test).    FOLLOW UP: Our staff will call the number listed on your records 24-72 hours following your procedure to check on you and address any questions or concerns that you may have regarding the information given to you following your procedure. If we do not reach you, we will leave a message.  We will attempt to reach you two times.  During this call, we will ask if you have developed any symptoms of COVID 19. If you develop any symptoms (ie: fever, flu-like symptoms, shortness of breath, cough etc.) before then, please call [phone].  If you test positive for Covid 19 in the 2 weeks post procedure, please call and report this information to [place].    If any biopsies were taken you will be contacted by phone or by letter within the next 1-3 weeks.  Please call us at [phone] if you have not heard about the biopsies in 3 weeks.    SIGNATURES/CONFIDENTIALITY: You and/or your care partner have signed paperwork which will be entered into your electronic medical record.  These signatures attest to the fact that that the information above on your After Visit Summary has been reviewed and is understood.  Full responsibility of the confidentiality of this  discharge information lies with you and/or your care-partner.  

## 2021-09-17 NOTE — Progress Notes (Signed)
Vss nad trns to pacu ?

## 2021-10-07 DIAGNOSIS — F4323 Adjustment disorder with mixed anxiety and depressed mood: Secondary | ICD-10-CM | POA: Diagnosis not present

## 2021-10-08 ENCOUNTER — Encounter: Payer: Self-pay | Admitting: Medical-Surgical

## 2021-10-08 ENCOUNTER — Ambulatory Visit: Payer: BC Managed Care – PPO | Admitting: Medical-Surgical

## 2021-10-08 VITALS — BP 120/84 | HR 85 | Resp 20 | Ht 68.0 in | Wt 221.2 lb

## 2021-10-08 DIAGNOSIS — Z7689 Persons encountering health services in other specified circumstances: Secondary | ICD-10-CM

## 2021-10-08 DIAGNOSIS — E669 Obesity, unspecified: Secondary | ICD-10-CM | POA: Diagnosis not present

## 2021-10-08 DIAGNOSIS — K7689 Other specified diseases of liver: Secondary | ICD-10-CM

## 2021-10-08 DIAGNOSIS — Z23 Encounter for immunization: Secondary | ICD-10-CM

## 2021-10-08 MED ORDER — TRETINOIN 0.025 % EX GEL: Freq: Every day | CUTANEOUS | 0 refills | Status: AC

## 2021-10-08 MED ORDER — PRAMIPEXOLE DIHYDROCHLORIDE 0.25 MG PO TABS
1.0000 mg | ORAL_TABLET | Freq: Every day | ORAL | Status: DC
Start: 2021-10-08 — End: 2023-02-24

## 2021-10-08 NOTE — Progress Notes (Signed)
New Patient Office Visit  Subjective:  Patient ID: Nancy Patel, female    DOB: 12/02/1975  Age: 46 y.o. MRN: [id number]  CC:  Chief Complaint  Patient presents with   Establish Care     HPI [name] presents to establish care. 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 It was recommended at the time to follow up with an [place] for further investigation. She did not have this procedure and wonders if that is something that should be done.   Mood: taking Mirapex and Mirtazapine. Managed by psychiatry.    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   Hepatic cyst 04/14/2020   Medical history non-contributory    NSVT (nonsustained ventricular tachycardia) (HCC) 06/18/2020   Palpitations 04/14/2020    Past Surgical History:  Procedure Laterality Date   GANGLION CYST EXCISION Left 2003   LAPAROSCOPIC APPENDECTOMY N/A 05/03/2018   Procedure: APPENDECTOMY LAPAROSCOPIC;  Surgeon: Darnell Level, MD;  Location: [facility];  Service: General;  Laterality: N/A;   WISDOM TOOTH EXTRACTION  2005   WRIST FRACTURE SURGERY Right 2010    Family History  Problem Relation Age of Onset   Colon polyps Mother    Hypertension Mother    Stroke Mother    Lung cancer Mother    Hypertension Father    Clotting disorder Brother    Heart attack Maternal Grandmother    Heart attack Maternal Grandfather    Colon cancer Neg Hx    Esophageal cancer Neg Hx    Rectal cancer Neg Hx    Stomach cancer Neg Hx     Social History   Socioeconomic History   Marital status: Single    Spouse name: Not on file   Number of children: Not on file   Years of education: Not on file   Highest  education level: Not on file  Occupational History   Not on file  Tobacco Use   Smoking status: Never   Smokeless tobacco: Never  Vaping Use   Vaping Use: Never used  Substance and Sexual Activity   Alcohol use: Yes    Alcohol/week: 2.0 standard drinks of alcohol    Types: 2 Glasses of wine per week    Comment: per week   Drug use: Never   Sexual activity: Yes    Birth control/protection: None  Other Topics Concern   Not on file  Social History Narrative   Not on file   Social Determinants of Health   Financial Resource Strain: Not on file  Food Insecurity: Not on file  Transportation Needs: Not on file  Physical Activity: Not on file  Stress: Not on file  Social Connections: Not on file  Intimate Partner Violence: Not on file   ROS Review of Systems  Constitutional:  Positive for unexpected weight change. Negative for chills, fatigue and fever.  HENT:  Negative for congestion, rhinorrhea, sinus pressure and sore throat.   Respiratory:  Negative for cough, chest tightness and shortness of breath.   Cardiovascular:  Negative for chest pain, palpitations and leg swelling.  Gastrointestinal:  Negative for abdominal pain, constipation, diarrhea, nausea and vomiting.  Endocrine: Negative for cold intolerance and heat intolerance.  Genitourinary:  Negative for dysuria, frequency, urgency, vaginal bleeding and vaginal discharge.  Musculoskeletal:  Positive for arthralgias (bilateral hips and elbows).  Skin:  Negative for rash and wound.  Neurological:  Negative for dizziness, light-headedness and headaches.  Hematological:  Does not bruise/bleed easily.  Psychiatric/Behavioral:  Positive for sleep disturbance. Negative for dysphoric mood, self-injury and suicidal ideas. The patient is not nervous/anxious.    Objective:   Today's Vitals: BP 120/84 (BP Location: Left Arm, Cuff Size: Large)   Pulse 85   Resp 20   Ht 5\' 8"  (1.727 m)   Wt 221 lb 3.2 oz (100.3 kg)   LMP  09/03/2021 (Approximate)   SpO2 97%   BMI 33.63 kg/m   Physical Exam Vitals reviewed.  Constitutional:      General: She is not in acute distress.    Appearance: Normal appearance. She is not ill-appearing.  HENT:     Head: Normocephalic and atraumatic.  Cardiovascular:     Rate and Rhythm: Normal rate and regular rhythm.     Pulses: Normal pulses.     Heart sounds: Normal heart sounds. No murmur heard.    No friction rub. No gallop.  Pulmonary:     Effort: Pulmonary effort is normal. No respiratory distress.     Breath sounds: Normal breath sounds. No wheezing.  Skin:    General: Skin is warm and dry.  Neurological:     Mental Status: She is alert and oriented to person, place, and time.  Psychiatric:        Mood and Affect: Mood normal.        Behavior: Behavior normal.        Thought Content: Thought content normal.        Judgment: Judgment normal.     Assessment & Plan:   1. Encounter to establish care Reviewed available information and discussed care concerns with patient.   2. Hepatic cyst RUQ 11/03/2021 ordered for further evaluation and monitoring on known hepatic cyst.  - US ABDOMEN LIMITED RUQ (LIVER/GB); Future  3. Obesity (BMI 30.0-34.9) Has an upcoming appointment with [facility] for weight management. Discussed recommendations for regular intentional exercise, increasing protein intake, and weighing/logging foods.   4. Need for tetanus booster Tdap given in office today.  - Tdap vaccine greater than or equal to 7yo IM   Outpatient Encounter Medications as of 10/08/2021  Medication Sig   aspirin EC 81 MG tablet Take 81 mg by mouth daily. Swallow whole.   Cholecalciferol (VITAMIN D3) 1.25 MG (50000 UT) CAPS Take 1 capsule by mouth once a week.   ferrous sulfate 325 (65 FE) MG tablet Take by mouth.   gabapentin (NEURONTIN) 300 MG capsule TAKE 1 TO 3 CAPSULES DAILY   metoprolol tartrate (LOPRESSOR) 25 MG tablet EVERY 12 HOURS AS NEEDED FOR PALPITATIONS    mirtazapine (REMERON) 15 MG tablet Take 15 mg by mouth at bedtime.   [DISCONTINUED] tretinoin (RETIN-A) 0.025 % gel Apply topically at bedtime.   pramipexole (MIRAPEX) 0.25 MG tablet Take 4 tablets (1 mg total) by mouth at bedtime.   tretinoin (RETIN-A) 0.025 % gel Apply topically at bedtime.   [DISCONTINUED] ondansetron (ZOFRAN) 4 MG tablet Take 4 mg by mouth every 8 (eight) hours as needed. (Patient not taking: Reported on 09/17/2021)   [DISCONTINUED] pramipexole (MIRAPEX) 0.25 MG tablet Take by mouth. (Patient not taking: Reported  on 09/17/2021)   [DISCONTINUED] prazosin (MINIPRESS) 1 MG capsule Take 1 mg by mouth at bedtime. (Patient not taking: Reported on 09/17/2021)   [DISCONTINUED] tretinoin (RETIN-A) 0.1 % cream Apply 1 application topically at bedtime. (Patient not taking: Reported on 10/08/2021)   No facility-administered encounter medications on file as of 10/08/2021.    Follow-up: Return for annual physical exam at your convenience.   Thayer Ohm, DNP, APRN, FNP-BC [facility]

## 2021-10-13 DIAGNOSIS — F5081 Binge eating disorder: Secondary | ICD-10-CM | POA: Diagnosis not present

## 2021-10-13 DIAGNOSIS — F341 Dysthymic disorder: Secondary | ICD-10-CM | POA: Diagnosis not present

## 2021-10-13 DIAGNOSIS — F411 Generalized anxiety disorder: Secondary | ICD-10-CM | POA: Diagnosis not present

## 2021-10-15 ENCOUNTER — Ambulatory Visit (INDEPENDENT_AMBULATORY_CARE_PROVIDER_SITE_OTHER): Payer: BC Managed Care – PPO

## 2021-10-15 DIAGNOSIS — K7689 Other specified diseases of liver: Secondary | ICD-10-CM

## 2021-10-15 DIAGNOSIS — Z0389 Encounter for observation for other suspected diseases and conditions ruled out: Secondary | ICD-10-CM | POA: Diagnosis not present

## 2021-10-19 DIAGNOSIS — F411 Generalized anxiety disorder: Secondary | ICD-10-CM | POA: Diagnosis not present

## 2021-10-19 DIAGNOSIS — E6609 Other obesity due to excess calories: Secondary | ICD-10-CM | POA: Diagnosis not present

## 2021-10-19 DIAGNOSIS — Z724 Inappropriate diet and eating habits: Secondary | ICD-10-CM | POA: Diagnosis not present

## 2021-10-19 DIAGNOSIS — Z6832 Body mass index (BMI) 32.0-32.9, adult: Secondary | ICD-10-CM | POA: Diagnosis not present

## 2021-10-20 DIAGNOSIS — F4323 Adjustment disorder with mixed anxiety and depressed mood: Secondary | ICD-10-CM | POA: Diagnosis not present

## 2021-11-03 DIAGNOSIS — F4323 Adjustment disorder with mixed anxiety and depressed mood: Secondary | ICD-10-CM | POA: Diagnosis not present

## 2021-11-08 DIAGNOSIS — F411 Generalized anxiety disorder: Secondary | ICD-10-CM | POA: Diagnosis not present

## 2021-11-08 DIAGNOSIS — F4320 Adjustment disorder, unspecified: Secondary | ICD-10-CM | POA: Diagnosis not present

## 2021-11-08 DIAGNOSIS — F341 Dysthymic disorder: Secondary | ICD-10-CM | POA: Diagnosis not present

## 2021-11-08 DIAGNOSIS — F5081 Binge eating disorder: Secondary | ICD-10-CM | POA: Diagnosis not present

## 2021-11-09 DIAGNOSIS — E6609 Other obesity due to excess calories: Secondary | ICD-10-CM | POA: Diagnosis not present

## 2021-11-09 DIAGNOSIS — Z6831 Body mass index (BMI) 31.0-31.9, adult: Secondary | ICD-10-CM | POA: Diagnosis not present

## 2021-11-09 DIAGNOSIS — K5903 Drug induced constipation: Secondary | ICD-10-CM | POA: Diagnosis not present

## 2021-11-11 DIAGNOSIS — Z6831 Body mass index (BMI) 31.0-31.9, adult: Secondary | ICD-10-CM | POA: Diagnosis not present

## 2021-11-11 DIAGNOSIS — E6609 Other obesity due to excess calories: Secondary | ICD-10-CM | POA: Diagnosis not present

## 2021-11-11 DIAGNOSIS — Z713 Dietary counseling and surveillance: Secondary | ICD-10-CM | POA: Diagnosis not present

## 2021-11-17 DIAGNOSIS — F4323 Adjustment disorder with mixed anxiety and depressed mood: Secondary | ICD-10-CM | POA: Diagnosis not present

## 2021-12-04 IMAGING — CT CT HEART MORP W/ CTA COR W/ SCORE W/ CA W/CM &/OR W/O CM
4 of 7 series · 8 of 20 positions shown, 9 images · non-contrast
Comparison: Chest CTA 02/13/2020.
COMPARISON: Chest CTA 02/13/2020.

Addendum:
EXAM:
OVER-READ INTERPRETATION  CT CHEST

The following report is an over-read performed by radiologist Dr.
Rtoyota Joshjax [REDACTED] on 05/01/2020. This
over-read does not include interpretation of cardiac or coronary
anatomy or pathology. The coronary calcium score/coronary CTA
interpretation by the cardiologist is attached.
CLINICAL DATA: 44F with factor V Leiden, VSD, and prior pulmonary
embolism with chest pain.
Cardiac/Coronary  CT
TECHNIQUE: The patient was scanned on a [employer] Force scanner.

[Series 6: best diast · axial · 0.39mm/px · z∈[-185,-146]mm · 2 of 293 slices shown, 3 images]
[im 98/293  vessel]
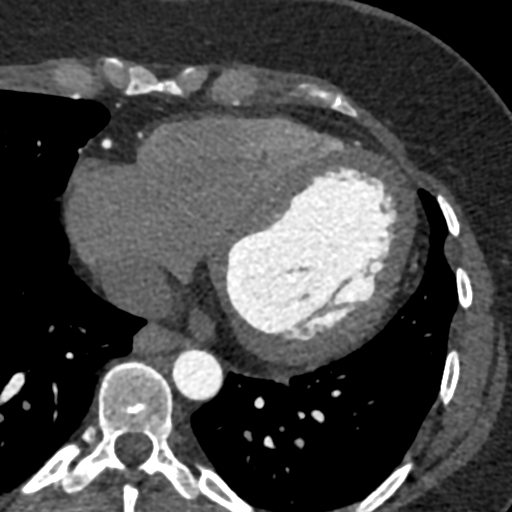
[im 98/293  lung]
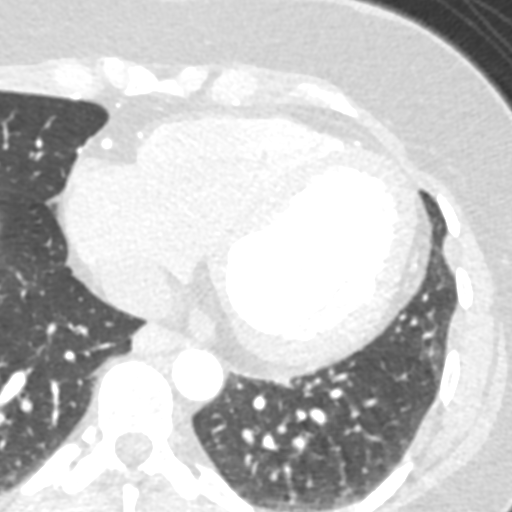
[im 195/293  vessel]
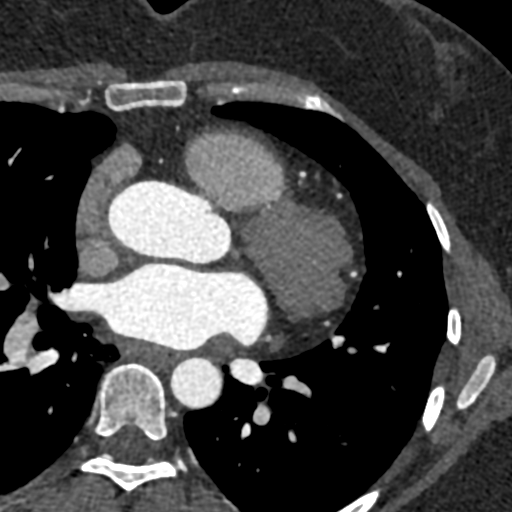

[Series 7: best syst · axial · 0.39mm/px · z∈[-185,-146]mm · 2 of 293 slices shown]
[im 98/293  vessel]
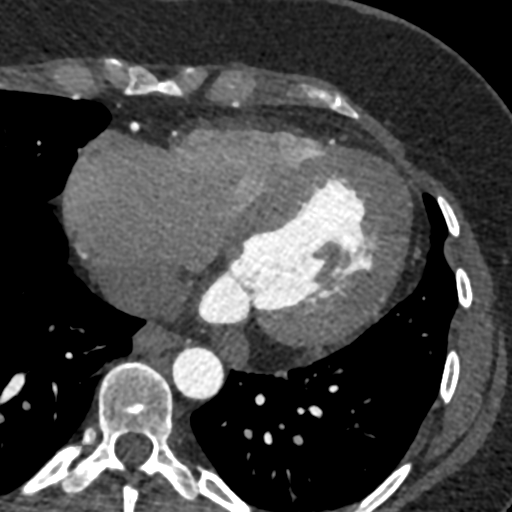
[im 195/293  vessel]
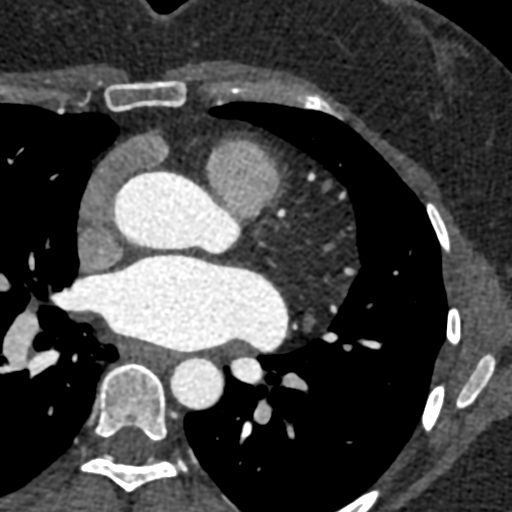

[Series 8: ts diast sharp 77 % · axial · 0.39mm/px · z∈[-185,-146]mm · 2 of 293 slices shown]
[im 98/293  lung]
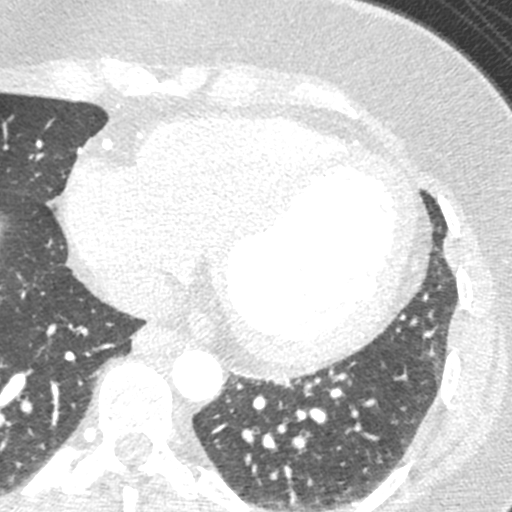
[im 195/293  lung]
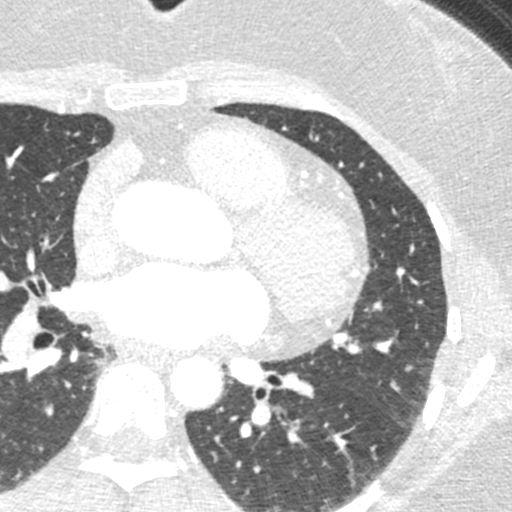

[Series 9: ts syst sharp · axial · 0.39mm/px · z∈[-185,-146]mm · 2 of 293 slices shown]
[im 98/293  lung]
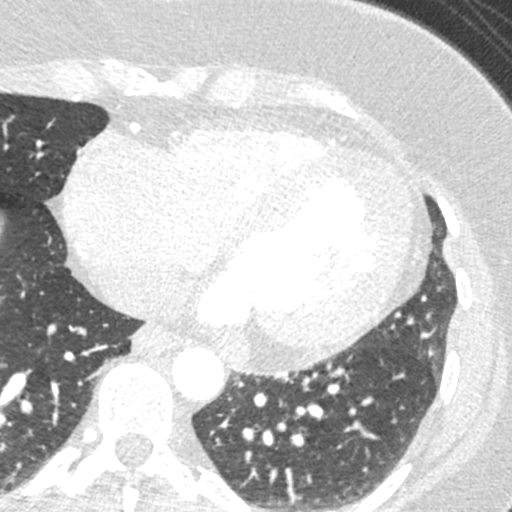
[im 195/293  lung]
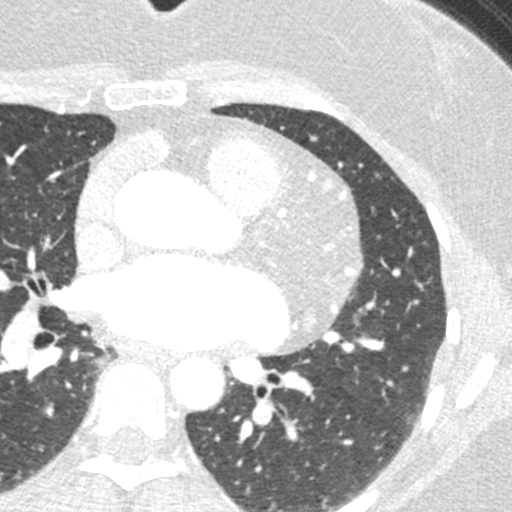

[8 of 20 positions shown; findings below may reference images not displayed]

FINDINGS: Within the visualized portions of the thorax there are no suspicious
appearing pulmonary nodules or masses, there is no acute
consolidative airspace disease, no pleural effusions, no
pneumothorax and no lymphadenopathy. Visualized portions of the
upper abdomen are unremarkable. There are no aggressive appearing
lytic or blastic lesions noted in the visualized portions of the
skeleton.
IMPRESSION: 1. No significant incidental noncardiac findings are noted.
FINDINGS: A 120 kV prospective scan was triggered in the descending thoracic
aorta at 111 HU's. Axial non-contrast 3 mm slices were carried out
through the heart. The data set was analyzed on a dedicated work
station and scored using the Agatson method. Gantry rotation speed
was 250 msecs and collimation was .6 mm. No beta blockade and 0.8 mg
of sl NTG was given. The 3D data set was reconstructed in 5%
intervals of the 67-82 % of the R-R cycle. Diastolic phases were
analyzed on a dedicated work station using MPR, MIP and VRT modes.
The patient received 80 cc of contrast.

Aorta: Normal size.  No calcifications.  No dissection.

Aortic Valve:  Trileaflet.  No calcifications.

Coronary Arteries:  Normal coronary origin.  Right dominance.

RCA is a large dominant artery that gives rise to PDA and two HAGLER
branches. There is no plaque.

Left main is a large artery that gives rise to LAD, RI, and LCX
arteries.

LAD is a large vessel that has no plaque.

LCX is a non-dominant artery that gives rise to one large OM1 branch
and a small OM2. There is no plaque.

There is tiny perimembranous VSD with left-to-right flow.

Other findings:

Normal pulmonary vein drainage into the left atrium.

Normal let atrial appendage without a thrombus.

Normal size of the pulmonary artery.
IMPRESSION: 1. Coronary calcium score of 0. This was 0 percentile for age and
sex matched control.

2. Normal coronary origin with right dominance.

3. No evidence of CAD.

4.  Consider non-cardiac causes of chest pain.

*** End of Addendum ***
EXAM:
OVER-READ INTERPRETATION  CT CHEST

The following report is an over-read performed by radiologist Dr.
Rtoyota Joshjax [REDACTED] on 05/01/2020. This
over-read does not include interpretation of cardiac or coronary
anatomy or pathology. The coronary calcium score/coronary CTA
interpretation by the cardiologist is attached.
FINDINGS: Within the visualized portions of the thorax there are no suspicious
appearing pulmonary nodules or masses, there is no acute
consolidative airspace disease, no pleural effusions, no
pneumothorax and no lymphadenopathy. Visualized portions of the
upper abdomen are unremarkable. There are no aggressive appearing
lytic or blastic lesions noted in the visualized portions of the
skeleton.
IMPRESSION: 1. No significant incidental noncardiac findings are noted.

## 2021-12-07 DIAGNOSIS — F4323 Adjustment disorder with mixed anxiety and depressed mood: Secondary | ICD-10-CM | POA: Diagnosis not present

## 2021-12-15 DIAGNOSIS — K219 Gastro-esophageal reflux disease without esophagitis: Secondary | ICD-10-CM | POA: Diagnosis not present

## 2021-12-15 DIAGNOSIS — Z6830 Body mass index (BMI) 30.0-30.9, adult: Secondary | ICD-10-CM | POA: Diagnosis not present

## 2021-12-15 DIAGNOSIS — E6609 Other obesity due to excess calories: Secondary | ICD-10-CM | POA: Diagnosis not present

## 2021-12-21 DIAGNOSIS — F4323 Adjustment disorder with mixed anxiety and depressed mood: Secondary | ICD-10-CM | POA: Diagnosis not present

## 2021-12-23 ENCOUNTER — Other Ambulatory Visit (HOSPITAL_COMMUNITY): Payer: Self-pay

## 2021-12-23 MED ORDER — MOUNJARO 5 MG/0.5ML ~~LOC~~ SOAJ
5.0000 mg | SUBCUTANEOUS | 0 refills | Status: DC
Start: 2021-12-20 — End: 2022-05-13
  Filled 2021-12-23: qty 2, 28d supply, fill #0

## 2021-12-24 ENCOUNTER — Other Ambulatory Visit (HOSPITAL_COMMUNITY): Payer: Self-pay

## 2022-01-11 DIAGNOSIS — F4323 Adjustment disorder with mixed anxiety and depressed mood: Secondary | ICD-10-CM | POA: Diagnosis not present

## 2022-01-13 ENCOUNTER — Other Ambulatory Visit (HOSPITAL_COMMUNITY): Payer: Self-pay

## 2022-01-13 MED ORDER — MOUNJARO 7.5 MG/0.5ML ~~LOC~~ SOAJ
7.5000 mg | SUBCUTANEOUS | 0 refills | Status: DC
  Filled 2022-01-13 – 2022-01-14 (×3): qty 2, 28d supply, fill #0

## 2022-01-14 ENCOUNTER — Other Ambulatory Visit (HOSPITAL_COMMUNITY): Payer: Self-pay

## 2022-01-18 DIAGNOSIS — Z713 Dietary counseling and surveillance: Secondary | ICD-10-CM | POA: Diagnosis not present

## 2022-01-18 DIAGNOSIS — E663 Overweight: Secondary | ICD-10-CM | POA: Diagnosis not present

## 2022-01-18 DIAGNOSIS — Z6828 Body mass index (BMI) 28.0-28.9, adult: Secondary | ICD-10-CM | POA: Diagnosis not present

## 2022-01-19 DIAGNOSIS — F4320 Adjustment disorder, unspecified: Secondary | ICD-10-CM | POA: Diagnosis not present

## 2022-01-19 DIAGNOSIS — F341 Dysthymic disorder: Secondary | ICD-10-CM | POA: Diagnosis not present

## 2022-01-19 DIAGNOSIS — F411 Generalized anxiety disorder: Secondary | ICD-10-CM | POA: Diagnosis not present

## 2022-01-19 DIAGNOSIS — F5081 Binge eating disorder: Secondary | ICD-10-CM | POA: Diagnosis not present

## 2022-01-25 DIAGNOSIS — E663 Overweight: Secondary | ICD-10-CM | POA: Diagnosis not present

## 2022-01-25 DIAGNOSIS — Z6828 Body mass index (BMI) 28.0-28.9, adult: Secondary | ICD-10-CM | POA: Diagnosis not present

## 2022-01-26 ENCOUNTER — Other Ambulatory Visit (HOSPITAL_COMMUNITY): Payer: Self-pay

## 2022-01-26 MED ORDER — MOUNJARO 10 MG/0.5ML ~~LOC~~ SOAJ
10.0000 mg | SUBCUTANEOUS | 0 refills | Status: DC
  Filled 2022-01-26: qty 2, 28d supply, fill #0

## 2022-02-01 DIAGNOSIS — F4323 Adjustment disorder with mixed anxiety and depressed mood: Secondary | ICD-10-CM | POA: Diagnosis not present

## 2022-02-07 ENCOUNTER — Other Ambulatory Visit (HOSPITAL_COMMUNITY): Payer: Self-pay

## 2022-02-10 DIAGNOSIS — F4323 Adjustment disorder with mixed anxiety and depressed mood: Secondary | ICD-10-CM | POA: Diagnosis not present

## 2022-02-17 DIAGNOSIS — F4323 Adjustment disorder with mixed anxiety and depressed mood: Secondary | ICD-10-CM | POA: Diagnosis not present

## 2022-02-22 ENCOUNTER — Other Ambulatory Visit (HOSPITAL_COMMUNITY): Payer: Self-pay

## 2022-02-22 DIAGNOSIS — Z6827 Body mass index (BMI) 27.0-27.9, adult: Secondary | ICD-10-CM | POA: Diagnosis not present

## 2022-02-22 DIAGNOSIS — E663 Overweight: Secondary | ICD-10-CM | POA: Diagnosis not present

## 2022-02-22 DIAGNOSIS — R630 Anorexia: Secondary | ICD-10-CM | POA: Diagnosis not present

## 2022-02-22 MED ORDER — MOUNJARO 10 MG/0.5ML ~~LOC~~ SOAJ
10.0000 mg | SUBCUTANEOUS | 0 refills | Status: DC
  Filled 2022-02-22: qty 2, 28d supply, fill #0

## 2022-02-23 ENCOUNTER — Other Ambulatory Visit (HOSPITAL_COMMUNITY): Payer: Self-pay

## 2022-02-25 ENCOUNTER — Other Ambulatory Visit (HOSPITAL_COMMUNITY): Payer: Self-pay

## 2022-03-02 ENCOUNTER — Other Ambulatory Visit (HOSPITAL_COMMUNITY): Payer: Self-pay

## 2022-03-02 DIAGNOSIS — F4323 Adjustment disorder with mixed anxiety and depressed mood: Secondary | ICD-10-CM | POA: Diagnosis not present

## 2022-03-03 ENCOUNTER — Other Ambulatory Visit (HOSPITAL_COMMUNITY): Payer: Self-pay

## 2022-03-15 ENCOUNTER — Other Ambulatory Visit (HOSPITAL_COMMUNITY): Payer: Self-pay

## 2022-03-15 DIAGNOSIS — Z6827 Body mass index (BMI) 27.0-27.9, adult: Secondary | ICD-10-CM | POA: Diagnosis not present

## 2022-03-15 DIAGNOSIS — E663 Overweight: Secondary | ICD-10-CM | POA: Diagnosis not present

## 2022-03-15 DIAGNOSIS — K5900 Constipation, unspecified: Secondary | ICD-10-CM | POA: Diagnosis not present

## 2022-03-15 MED ORDER — MOUNJARO 10 MG/0.5ML ~~LOC~~ SOAJ
SUBCUTANEOUS | 0 refills | Status: DC
  Filled 2022-03-15: qty 2, 28d supply, fill #0

## 2022-03-16 DIAGNOSIS — F419 Anxiety disorder, unspecified: Secondary | ICD-10-CM | POA: Diagnosis not present

## 2022-03-16 DIAGNOSIS — F4323 Adjustment disorder with mixed anxiety and depressed mood: Secondary | ICD-10-CM | POA: Diagnosis not present

## 2022-03-16 DIAGNOSIS — F5081 Binge eating disorder: Secondary | ICD-10-CM | POA: Diagnosis not present

## 2022-03-16 DIAGNOSIS — F341 Dysthymic disorder: Secondary | ICD-10-CM | POA: Diagnosis not present

## 2022-03-22 DIAGNOSIS — F4323 Adjustment disorder with mixed anxiety and depressed mood: Secondary | ICD-10-CM | POA: Diagnosis not present

## 2022-03-25 ENCOUNTER — Other Ambulatory Visit (HOSPITAL_COMMUNITY): Payer: Self-pay

## 2022-04-01 ENCOUNTER — Ambulatory Visit (HOSPITAL_BASED_OUTPATIENT_CLINIC_OR_DEPARTMENT_OTHER): Payer: BC Managed Care – PPO | Admitting: Family

## 2022-04-19 DIAGNOSIS — F4323 Adjustment disorder with mixed anxiety and depressed mood: Secondary | ICD-10-CM | POA: Diagnosis not present

## 2022-05-03 DIAGNOSIS — Z6826 Body mass index (BMI) 26.0-26.9, adult: Secondary | ICD-10-CM | POA: Diagnosis not present

## 2022-05-03 DIAGNOSIS — R632 Polyphagia: Secondary | ICD-10-CM | POA: Diagnosis not present

## 2022-05-03 DIAGNOSIS — E663 Overweight: Secondary | ICD-10-CM | POA: Diagnosis not present

## 2022-05-04 ENCOUNTER — Other Ambulatory Visit (HOSPITAL_COMMUNITY): Payer: Self-pay

## 2022-05-04 ENCOUNTER — Other Ambulatory Visit: Payer: Self-pay

## 2022-05-04 MED ORDER — MOUNJARO 12.5 MG/0.5ML ~~LOC~~ SOAJ
12.5000 mg | SUBCUTANEOUS | 0 refills | Status: DC
  Filled 2022-05-04: qty 2, 28d supply, fill #0

## 2022-05-12 NOTE — Progress Notes (Signed)
Cardiology Office Note:    Date:  05/13/2022   ID:  Nancy Patel, DOB 20-Dec-1975, MRN [id number]  PCP:  Samuel Bouche, NP   [place] Providers Cardiologist:  None     Referring MD: Samuel Bouche, NP   Chief Complaint  Patient presents with   Annual Exam    Seen for Dr. Oval Linsey   History of Present Illness:    Nancy Patel is a 47 y.o. female with a hx of factor V Leiden, VSD, palpitations, prior PE.  Initially seen in January 2022 for evaluation of chest discomfort, she had intermittent chest tightness for several days.  She was sent to an urgent care, they sent her to the ED because her D-dimer was high.  CTA was negative for PE.  She was sent for coronary CTA, that revealed no CAD, calcium score 0.  She also wore a 3-day ZIO that revealed a run of NSVT, she was asymptomatic with that.  Previously seen in our office on 06/18/2020 by Dr. Oval Linsey, at that time she was doing well from a cardiac perspective.  She presents today for follow up of her palpitations. She has been doing well from a cardiac perspective, she occasionally noticed palpitations, but they are much better than they were in the past. She has lost > 50 lbs over the last year and is feeling good. She was working with a Physiological scientist, but that became too cost prohibitive and she has primarily been walking for exercise. She denies chest pain, dyspnea, pnd, orthopnea, n, v, dizziness, syncope, edema, weight gain, or early satiety.    Past Medical History:  Diagnosis Date   Anemia    PAST HX   Anxiety    Atypical chest pain 04/14/2020   Clotting disorder ([place]) 2020   PE AFTER APPENDECTOMY 2020   Depression    Factor V Leiden ([place]) 04/14/2020   Heart murmur    BORN WITH VSD BUT CLOSED   Hepatic cyst 04/14/2020   Medical history non-contributory    NSVT (nonsustained ventricular tachycardia) ([place]) 06/18/2020   Palpitations 04/14/2020    Past Surgical History:  Procedure Laterality Date    GANGLION CYST EXCISION Left 2003   LAPAROSCOPIC APPENDECTOMY N/A 05/03/2018   Procedure: APPENDECTOMY LAPAROSCOPIC;  Surgeon: Armandina Gemma, MD;  Location: [facility];  Service: General;  Laterality: N/A;   WISDOM TOOTH EXTRACTION  2005   WRIST FRACTURE SURGERY Right 2010    Current Medications: Current Meds  Medication Sig   aspirin 325 MG tablet Take 325 mg by mouth daily.   Cholecalciferol (VITAMIN D3) 1.25 MG (50000 UT) CAPS Take 1 capsule by mouth once a week.   ferrous sulfate 325 (65 FE) MG tablet Take by mouth.   gabapentin (NEURONTIN) 300 MG capsule TAKE 1 TO 3 CAPSULES DAILY   metoprolol tartrate (LOPRESSOR) 25 MG tablet EVERY 12 HOURS AS NEEDED FOR PALPITATIONS   mirtazapine (REMERON) 15 MG tablet Take 15 mg by mouth at bedtime.   pramipexole (MIRAPEX) 0.25 MG tablet Take 4 tablets (1 mg total) by mouth at bedtime.   tirzepatide (MOUNJARO) 12.5 MG/0.5ML Pen Inject 12.5 mg into the skin once a week.   tretinoin (RETIN-A) 0.025 % gel Apply topically at bedtime.     Allergies:   Patient has no known allergies.   Social History   Socioeconomic History   Marital status: Single    Spouse name: Not on file   Number of children: Not on file   Years of education:  Not on file   Highest education level: Not on file  Occupational History   Not on file  Tobacco Use   Smoking status: Never   Smokeless tobacco: Never  Vaping Use   Vaping Use: Never used  Substance and Sexual Activity   Alcohol use: Yes    Alcohol/week: 2.0 standard drinks of alcohol    Types: 2 Glasses of wine per week    Comment: per week   Drug use: Never   Sexual activity: Yes    Birth control/protection: None  Other Topics Concern   Not on file  Social History Narrative   Not on file   Social Determinants of Health   Financial Resource Strain: Not on file  Food Insecurity: Not on file  Transportation Needs: Not on file  Physical Activity: Not on file  Stress: Not on file  Social Connections: Not  on file     Family History: The patient's family history includes Clotting disorder in her brother; Colon polyps in her mother; Heart attack in her maternal grandfather and maternal grandmother; Hypertension in her father and mother; Lung cancer in her mother; Stroke in her mother. There is no history of Colon cancer, Esophageal cancer, Rectal cancer, or Stomach cancer.  ROS:   Please see the history of present illness.    All other systems reviewed and are negative.  EKGs/Labs/Other Studies Reviewed:    The following studies were reviewed today:  Echo 05/2018:  1. The left ventricle has normal systolic function with an ejection  fraction of 60-65%. The cavity size was normal. Left ventricular diastolic  Doppler parameters are consistent with pseudonormalization No evidence of  left ventricular regional wall  motion abnormalities.   2. The right ventricle has normal systolic function. The cavity was  normal. There is no increase in right ventricular wall thickness.   3. The mitral valve is normal in structure.   4. The tricuspid valve is normal in structure.   5. The aortic valve is normal in structure.   6. The aortic root and ascending aorta are normal in size and structure.   7. No evidence of left ventricular regional wall motion abnormalities.   8. Right atrial pressure is estimated at 3 mmHg.   9. The interatrial septum was not assessed  3 Day Zio Monitor 05/2019:   Quality: Fair.  Baseline artifact. Predominant rhythm: sinus rhythm Average heart rate: 90 bpm Max heart rate: 146 bpm Min heart rate: 64 bpm Pauses >2.5 seconds: none   Patient did submit a symptom diary which correlated with PVCs 16 beats NSVT.  Rate 113 bpm. Rare PACs and PVCs  Coronary CT-A 05/01/20: IMPRESSION: 1. Coronary calcium score of 0. This was 0 percentile for age and sex matched control. 2. Normal coronary origin with right dominance. 3. No evidence of CAD. 4.  Consider non-cardiac causes  of chest pain.  EKG:  EKG is  ordered today.  The ekg ordered today demonstrates NSR, HR 85 bpm.   Recent Labs: No results found for requested labs within last 365 days.  Recent Lipid Panel No results found for: "CHOL", "TRIG", "HDL", "CHOLHDL", "VLDL", "LDLCALC", "LDLDIRECT"   Risk Assessment/Calculations:                Physical Exam:    VS:  BP 116/86   Pulse 85   Ht 5' 8"$  (1.727 m)   Wt 172 lb (78 kg)   BMI 26.15 kg/m     Wt Readings from Last  3 Encounters:  05/13/22 172 lb (78 kg)  10/08/21 221 lb 3.2 oz (100.3 kg)  09/17/21 226 lb (102.5 kg)     GEN:  Well nourished, well developed in no acute distress HEENT: Normal NECK: No JVD; No carotid bruits LYMPHATICS: No lymphadenopathy CARDIAC: RRR, no murmurs, rubs, gallops RESPIRATORY:  Clear to auscultation without rales, wheezing or rhonchi  ABDOMEN: Soft, non-tender, non-distended MUSCULOSKELETAL:  No edema; No deformity  SKIN: Warm and dry NEUROLOGIC:  Alert and oriented x 3 PSYCHIATRIC:  Normal affect   ASSESSMENT:    1. NSVT (nonsustained ventricular tachycardia) ([place])   2. Atypical chest pain   3. Single subsegmental pulmonary embolism without acute cor pulmonale (HCC)   4. VSD (ventricular septal defect)    PLAN:    In order of problems listed above:  NSVT/Palpitations - largely quiescent, occasionally takes her PRN metoprolol when needed.  History of PE - on ASA 325 mg daily, no recurrence of PE. 3.   VSD - per Dr. Blenda Mounts last note, there was a tiny VSD on coronary CT-A. Repaired per patient report.    Disposition - Return in 1 year with Dr. Oval Linsey.      Medication Adjustments/Labs and Tests Ordered: Current medicines are reviewed at length with the patient today.  Concerns regarding medicines are outlined above.  Orders Placed This Encounter  Procedures   EKG 12-Lead   No orders of the defined types were placed in this encounter.   Patient Instructions  Medication Instructions:   Continue your current medications.  *If you need a refill on your cardiac medications before your next appointment, please call your pharmacy*   Lab Work/Testing/Procedures: None ordered today.  Follow-Up: At [facility], you and your health needs are our priority.  As part of our continuing mission to provide you with exceptional heart care, we have created designated Provider Care Teams.  These Care Teams include your primary Cardiologist (physician) and Advanced Practice Providers (APPs -  Physician Assistants and Nurse Practitioners) who all work together to provide you with the care you need, when you need it.  We recommend signing up for the patient portal called "MyChart".  Sign up information is provided on this After Visit Summary.  MyChart is used to connect with patients for Virtual Visits (Telemedicine).  Patients are able to view lab/test results, encounter notes, upcoming appointments, etc.  Non-urgent messages can be sent to your provider as well.   To learn more about what you can do with MyChart, go to [email or web address].    Your next appointment:   1 year(s)  Provider:   Skeet Latch, MD    Other Instructions  Heart Healthy Diet Recommendations: A low-salt diet is recommended. Meats should be grilled, baked, or boiled. Avoid fried foods. Focus on lean protein sources like fish or chicken with vegetables and fruits. The American Heart Association is a Microbiologist!  American Heart Association Diet and Lifeystyle Recommendations   Exercise recommendations: The American Heart Association recommends 150 minutes of moderate intensity exercise weekly. Try 30 minutes of moderate intensity exercise 4-5 times per week. This could include walking, jogging, or swimming.    Signed, Trudi Ida, NP  05/13/2022 11:58 AM    [place]

## 2022-05-13 ENCOUNTER — Ambulatory Visit (HOSPITAL_BASED_OUTPATIENT_CLINIC_OR_DEPARTMENT_OTHER): Payer: BC Managed Care – PPO | Admitting: Cardiology

## 2022-05-13 ENCOUNTER — Encounter (HOSPITAL_BASED_OUTPATIENT_CLINIC_OR_DEPARTMENT_OTHER): Payer: Self-pay | Admitting: Cardiology

## 2022-05-13 VITALS — BP 116/86 | HR 85 | Ht 68.0 in | Wt 172.0 lb

## 2022-05-13 DIAGNOSIS — I2693 Single subsegmental pulmonary embolism without acute cor pulmonale: Secondary | ICD-10-CM | POA: Diagnosis not present

## 2022-05-13 DIAGNOSIS — Q210 Ventricular septal defect: Secondary | ICD-10-CM | POA: Diagnosis not present

## 2022-05-13 DIAGNOSIS — I4729 Other ventricular tachycardia: Secondary | ICD-10-CM | POA: Diagnosis not present

## 2022-05-13 DIAGNOSIS — R0789 Other chest pain: Secondary | ICD-10-CM | POA: Diagnosis not present

## 2022-05-13 NOTE — Patient Instructions (Addendum)
Medication Instructions:  Continue your current medications.  *If you need a refill on your cardiac medications before your next appointment, please call your pharmacy*   Lab Work/Testing/Procedures: None ordered today.  Follow-Up: At [facility], you and your health needs are our priority.  As part of our continuing mission to provide you with exceptional heart care, we have created designated Provider Care Teams.  These Care Teams include your primary Cardiologist (physician) and Advanced Practice Providers (APPs -  Physician Assistants and Nurse Practitioners) who all work together to provide you with the care you need, when you need it.  We recommend signing up for the patient portal called "MyChart".  Sign up information is provided on this After Visit Summary.  MyChart is used to connect with patients for Virtual Visits (Telemedicine).  Patients are able to view lab/test results, encounter notes, upcoming appointments, etc.  Non-urgent messages can be sent to your provider as well.   To learn more about what you can do with MyChart, go to [email or web address].    Your next appointment:   1 year(s)  Provider:   Skeet Latch, MD    Other Instructions  Heart Healthy Diet Recommendations: A low-salt diet is recommended. Meats should be grilled, baked, or boiled. Avoid fried foods. Focus on lean protein sources like fish or chicken with vegetables and fruits. The American Heart Association is a Microbiologist!  American Heart Association Diet and Lifeystyle Recommendations   Exercise recommendations: The American Heart Association recommends 150 minutes of moderate intensity exercise weekly. Try 30 minutes of moderate intensity exercise 4-5 times per week. This could include walking, jogging, or swimming.

## 2022-05-26 DIAGNOSIS — F4323 Adjustment disorder with mixed anxiety and depressed mood: Secondary | ICD-10-CM | POA: Diagnosis not present

## 2022-05-30 ENCOUNTER — Other Ambulatory Visit (HOSPITAL_BASED_OUTPATIENT_CLINIC_OR_DEPARTMENT_OTHER): Payer: Self-pay | Admitting: Advanced Practice Midwife

## 2022-05-30 DIAGNOSIS — Z1231 Encounter for screening mammogram for malignant neoplasm of breast: Secondary | ICD-10-CM

## 2022-05-31 DIAGNOSIS — Z6824 Body mass index (BMI) 24.0-24.9, adult: Secondary | ICD-10-CM | POA: Diagnosis not present

## 2022-05-31 DIAGNOSIS — N926 Irregular menstruation, unspecified: Secondary | ICD-10-CM | POA: Diagnosis not present

## 2022-05-31 DIAGNOSIS — Z8639 Personal history of other endocrine, nutritional and metabolic disease: Secondary | ICD-10-CM | POA: Diagnosis not present

## 2022-05-31 DIAGNOSIS — Z79899 Other long term (current) drug therapy: Secondary | ICD-10-CM | POA: Diagnosis not present

## 2022-06-01 ENCOUNTER — Other Ambulatory Visit (HOSPITAL_COMMUNITY): Payer: Self-pay

## 2022-06-01 MED ORDER — MOUNJARO 12.5 MG/0.5ML ~~LOC~~ SOAJ
12.5000 mg | SUBCUTANEOUS | 0 refills | Status: DC
  Filled 2022-06-01: qty 2, 28d supply, fill #0

## 2022-06-03 ENCOUNTER — Other Ambulatory Visit (HOSPITAL_COMMUNITY)
Admission: RE | Admit: 2022-06-03 | Discharge: 2022-06-03 | Disposition: A | Discharge status: Home / Self Care | Payer: BC Managed Care – PPO | Source: Ambulatory Visit | Attending: Medical | Admitting: Medical

## 2022-06-03 ENCOUNTER — Encounter (HOSPITAL_BASED_OUTPATIENT_CLINIC_OR_DEPARTMENT_OTHER): Payer: Self-pay | Admitting: Medical

## 2022-06-03 ENCOUNTER — Ambulatory Visit (HOSPITAL_BASED_OUTPATIENT_CLINIC_OR_DEPARTMENT_OTHER)
Admission: RE | Admit: 2022-06-03 | Discharge: 2022-06-03 | Disposition: A | Discharge status: Home / Self Care | Payer: BC Managed Care – PPO | Source: Ambulatory Visit | Attending: Advanced Practice Midwife | Admitting: Advanced Practice Midwife

## 2022-06-03 ENCOUNTER — Ambulatory Visit (INDEPENDENT_AMBULATORY_CARE_PROVIDER_SITE_OTHER): Payer: BC Managed Care – PPO | Admitting: Medical

## 2022-06-03 ENCOUNTER — Other Ambulatory Visit (HOSPITAL_COMMUNITY): Payer: Self-pay

## 2022-06-03 ENCOUNTER — Inpatient Hospital Stay (HOSPITAL_BASED_OUTPATIENT_CLINIC_OR_DEPARTMENT_OTHER): Admission: RE | Admit: 2022-06-03 | Payer: BC Managed Care – PPO | Source: Ambulatory Visit | Admitting: Radiology

## 2022-06-03 VITALS — BP 119/86 | HR 86 | Ht 68.5 in | Wt 166.6 lb

## 2022-06-03 DIAGNOSIS — R87810 Cervical high risk human papillomavirus (HPV) DNA test positive: Secondary | ICD-10-CM | POA: Insufficient documentation

## 2022-06-03 DIAGNOSIS — Z113 Encounter for screening for infections with a predominantly sexual mode of transmission: Secondary | ICD-10-CM | POA: Diagnosis not present

## 2022-06-03 DIAGNOSIS — Z01419 Encounter for gynecological examination (general) (routine) without abnormal findings: Secondary | ICD-10-CM

## 2022-06-03 DIAGNOSIS — Z1231 Encounter for screening mammogram for malignant neoplasm of breast: Secondary | ICD-10-CM

## 2022-06-03 NOTE — Progress Notes (Signed)
History:  [name] is a 47 y.o. G1P0010 who presents to clinic today for annual exam with pap smear. Patient had normal pap smear 05/2021 with +HPV, the HPV was recurrent, so colpo was done and negative 08/2021. Repeat pap smear with cotesting was recommended at 1 year. Patient also states irregular periods over the last year. Periods have lasted up to 3 weeks or been up to 6 weeks apart. Bleeding is heavy with cramping, which was not her norm prior to the last year.    The following portions of the patient's history were reviewed and updated as appropriate: allergies, current medications, family history, past medical history, social history, past surgical history and problem list.  Review of Systems:  Review of Systems  Constitutional:  Negative for fever and malaise/fatigue.  Gastrointestinal:  Negative for abdominal pain, constipation, diarrhea, nausea and vomiting.  Genitourinary:  Negative for dysuria, frequency and urgency.       Neg - discharge, pelvic pain + vaginal bleeding      Objective:  Physical Exam BP 119/86   Pulse 86   Ht 5' 8.5" (1.74 m)   Wt 166 lb 9.6 oz (75.6 kg)   LMP 05/29/2022   BMI 24.96 kg/m  Physical Exam Vitals and nursing note reviewed. Exam conducted with a chaperone present.  Constitutional:      General: She is not in acute distress.    Appearance: Normal appearance. She is well-developed and normal weight.  HENT:     Head: Normocephalic and atraumatic.  Neck:     Thyroid: No thyromegaly.  Cardiovascular:     Rate and Rhythm: Normal rate and regular rhythm.     Heart sounds: No murmur heard. Pulmonary:     Effort: Pulmonary effort is normal. No respiratory distress.     Breath sounds: Normal breath sounds. No wheezing.  Chest:  Breasts:    Right: No swelling, bleeding, inverted nipple, mass, nipple discharge, skin change or tenderness.     Left: No swelling, bleeding, inverted nipple, mass, nipple discharge, skin change or  tenderness.  Abdominal:     General: Abdomen is flat. Bowel sounds are normal. There is no distension.     Palpations: Abdomen is soft. There is no mass.     Tenderness: There is no abdominal tenderness. There is no guarding or rebound.  Genitourinary:    General: Normal vulva.     Vagina: Bleeding (scant, brown) present. No vaginal discharge, erythema or tenderness.     Cervix: No cervical motion tenderness, discharge, friability, lesion, erythema or cervical bleeding.     Uterus: Not enlarged and not tender.      Adnexa:        Right: No mass or tenderness.         Left: No mass or tenderness.    Musculoskeletal:     Cervical back: Neck supple.  Skin:    General: Skin is warm and dry.     Findings: No erythema.  Neurological:     Mental Status: She is alert and oriented to person, place, and time.  Psychiatric:        Mood and Affect: Mood normal.    Health Maintenance Due  Topic Date Due   Hepatitis C Screening  Never done   COVID-19 Vaccine (3 - Pfizer risk series) 05/10/2019    Labs, imaging and previous visits in Epic reviewed  Assessment & Plan:  1. Cervical high risk HPV (human papillomavirus) test positive - Cytology -  PAP( [place]) including GC/CT and trichomonas testing   2. Well woman exam - CBC - Comp Met (CMET) - Vitamin D (25 hydroxy) - TSH - Lipid panel  3. Screening for STD - HIV Antibody (routine testing w rflx) - RPR - Hepatitis B surface antigen - Hepatitis C antibody  4. Abnormal uterine bleeding  - Will schedule to return for endometrial biopsy with Dr. Sabra Heck as soon as possible  - Consider non-estrogen based therapy, due to factor 5 liden  - Discussed options for progesterone pills, IUD, Depo or ablation to safely remove or regulate bleeding    Nancy Redden, PA-C 06/03/2022 10:19 AM

## 2022-06-04 LAB — COMPREHENSIVE METABOLIC PANEL
ALT: 14 IU/L (ref 0–32)
AST: 17 IU/L (ref 0–40)
Albumin/Globulin Ratio: 2.9 — ABNORMAL HIGH (ref 1.2–2.2)
Albumin: 4.7 g/dL (ref 3.9–4.9)
Alkaline Phosphatase: 83 IU/L (ref 44–121)
BUN/Creatinine Ratio: 13 (ref 9–23)
BUN: 11 mg/dL (ref 6–24)
Bilirubin Total: 0.3 mg/dL (ref 0.0–1.2)
CO2: 20 mmol/L (ref 20–29)
Calcium: 9.2 mg/dL (ref 8.7–10.2)
Chloride: 102 mmol/L (ref 96–106)
Creatinine, Ser: 0.87 mg/dL (ref 0.57–1.00)
Globulin, Total: 1.6 g/dL (ref 1.5–4.5)
Glucose: 76 mg/dL (ref 70–99)
Potassium: 4.6 mmol/L (ref 3.5–5.2)
Sodium: 137 mmol/L (ref 134–144)
Total Protein: 6.3 g/dL (ref 6.0–8.5)
eGFR: 83 mL/min/{1.73_m2} (ref >59)

## 2022-06-04 LAB — HEPATITIS C ANTIBODY: Hep C Virus Ab: NONREACTIVE

## 2022-06-04 LAB — CBC
Hematocrit: 43.2 % (ref 34.0–46.6)
Hemoglobin: 14.8 g/dL (ref 11.1–15.9)
MCH: 30 pg (ref 26.6–33.0)
MCHC: 34.3 g/dL (ref 31.5–35.7)
MCV: 87 fL (ref 79–97)
Platelets: 214 10*3/uL (ref 150–450)
RBC: 4.94 x10E6/uL (ref 3.77–5.28)
RDW: 12.3 % (ref 11.7–15.4)
WBC: 7.3 10*3/uL (ref 3.4–10.8)

## 2022-06-04 LAB — LIPID PANEL
Chol/HDL Ratio: 3.3 ratio (ref 0.0–4.4)
Cholesterol, Total: 187 mg/dL (ref 100–199)
HDL: 57 mg/dL (ref >39)
LDL Chol Calc (NIH): 116 mg/dL — ABNORMAL HIGH (ref 0–99)
Triglycerides: 77 mg/dL (ref 0–149)
VLDL Cholesterol Cal: 14 mg/dL (ref 5–40)

## 2022-06-04 LAB — TSH: TSH: 2.46 u[IU]/mL (ref 0.450–4.500)

## 2022-06-04 LAB — VITAMIN D 25 HYDROXY (VIT D DEFICIENCY, FRACTURES): Vit D, 25-Hydroxy: 30.7 ng/mL (ref 30.0–100.0)

## 2022-06-04 LAB — RPR: RPR Ser Ql: NONREACTIVE

## 2022-06-04 LAB — HEPATITIS B SURFACE ANTIGEN: Hepatitis B Surface Ag: NEGATIVE

## 2022-06-04 LAB — HIV ANTIBODY (ROUTINE TESTING W REFLEX): HIV Screen 4th Generation wRfx: NONREACTIVE

## 2022-06-07 LAB — CYTOLOGY - PAP
Chlamydia: NEGATIVE
Comment: NEGATIVE
Comment: NEGATIVE
Comment: NEGATIVE
Comment: NEGATIVE
Comment: NEGATIVE
Comment: NORMAL
Diagnosis: UNDETERMINED — AB
HPV 16: NEGATIVE
HPV 18 / 45: NEGATIVE
High risk HPV: POSITIVE — AB
Neisseria Gonorrhea: NEGATIVE
Trichomonas: NEGATIVE

## 2022-06-08 DIAGNOSIS — F4323 Adjustment disorder with mixed anxiety and depressed mood: Secondary | ICD-10-CM | POA: Diagnosis not present

## 2022-06-13 ENCOUNTER — Telehealth (HOSPITAL_BASED_OUTPATIENT_CLINIC_OR_DEPARTMENT_OTHER): Payer: Self-pay | Admitting: *Deleted

## 2022-06-13 NOTE — Telephone Encounter (Signed)
Patient called and left a message that was waiting on call from the nurse to schedule a colposcopy.

## 2022-06-13 NOTE — Telephone Encounter (Signed)
Called pt and set up appt for colpo and EMB

## 2022-06-22 DIAGNOSIS — F419 Anxiety disorder, unspecified: Secondary | ICD-10-CM | POA: Diagnosis not present

## 2022-06-22 DIAGNOSIS — F5081 Binge eating disorder: Secondary | ICD-10-CM | POA: Diagnosis not present

## 2022-06-22 DIAGNOSIS — F341 Dysthymic disorder: Secondary | ICD-10-CM | POA: Diagnosis not present

## 2022-06-23 DIAGNOSIS — F4323 Adjustment disorder with mixed anxiety and depressed mood: Secondary | ICD-10-CM | POA: Diagnosis not present

## 2022-06-28 ENCOUNTER — Other Ambulatory Visit (HOSPITAL_BASED_OUTPATIENT_CLINIC_OR_DEPARTMENT_OTHER): Payer: Self-pay

## 2022-06-28 ENCOUNTER — Other Ambulatory Visit (HOSPITAL_COMMUNITY): Payer: Self-pay

## 2022-06-28 DIAGNOSIS — Z79899 Other long term (current) drug therapy: Secondary | ICD-10-CM | POA: Diagnosis not present

## 2022-06-28 DIAGNOSIS — Z6824 Body mass index (BMI) 24.0-24.9, adult: Secondary | ICD-10-CM | POA: Diagnosis not present

## 2022-06-28 DIAGNOSIS — Z8639 Personal history of other endocrine, nutritional and metabolic disease: Secondary | ICD-10-CM | POA: Diagnosis not present

## 2022-06-28 MED ORDER — ZEPBOUND 12.5 MG/0.5ML ~~LOC~~ SOAJ
SUBCUTANEOUS | 0 refills | Status: DC
  Filled 2022-06-28: qty 2, 28d supply, fill #0

## 2022-06-28 MED ORDER — ZEPBOUND 12.5 MG/0.5ML ~~LOC~~ SOAJ
SUBCUTANEOUS | 0 refills | Status: DC
  Filled 2022-06-29 – 2022-07-22 (×4): qty 2, 28d supply, fill #0

## 2022-06-29 ENCOUNTER — Other Ambulatory Visit (HOSPITAL_COMMUNITY): Payer: Self-pay

## 2022-06-30 ENCOUNTER — Ambulatory Visit (HOSPITAL_BASED_OUTPATIENT_CLINIC_OR_DEPARTMENT_OTHER): Payer: BC Managed Care – PPO | Admitting: Obstetrics & Gynecology

## 2022-06-30 ENCOUNTER — Other Ambulatory Visit (HOSPITAL_COMMUNITY)
Admission: RE | Admit: 2022-06-30 | Discharge: 2022-06-30 | Disposition: A | Discharge status: Home / Self Care | Payer: BC Managed Care – PPO | Source: Ambulatory Visit | Attending: Obstetrics & Gynecology | Admitting: Obstetrics & Gynecology

## 2022-06-30 DIAGNOSIS — N926 Irregular menstruation, unspecified: Secondary | ICD-10-CM | POA: Diagnosis not present

## 2022-06-30 DIAGNOSIS — R87610 Atypical squamous cells of undetermined significance on cytologic smear of cervix (ASC-US): Secondary | ICD-10-CM

## 2022-06-30 DIAGNOSIS — N870 Mild cervical dysplasia: Secondary | ICD-10-CM | POA: Diagnosis not present

## 2022-06-30 DIAGNOSIS — R87810 Cervical high risk human papillomavirus (HPV) DNA test positive: Secondary | ICD-10-CM | POA: Insufficient documentation

## 2022-06-30 DIAGNOSIS — N72 Inflammatory disease of cervix uteri: Secondary | ICD-10-CM | POA: Diagnosis not present

## 2022-06-30 DIAGNOSIS — N858 Other specified noninflammatory disorders of uterus: Secondary | ICD-10-CM | POA: Diagnosis not present

## 2022-06-30 NOTE — Progress Notes (Signed)
CC:  abnormal pap smear and abnormal bleeding  47 y.o. G1P0010 Single Not Hispanic or Latino female here for additional evaluation of abnormal pap smear obtained with Kerry Hough, PA, on 06/03/2022.  Pap reviewed with pt showing ASCUS with neg HR HPV.  Colposcopy with possible biopsies recommended.  Pt not recently SA.  She did report at last visit, prolong menstrual bleeding as well with bleeding lasting 3 weeks.  Evaluation with biopsy also recommended.  Will plan to do this today.  Prior evaluation/treatment:  colposcopy 08/19/2021 with Dr. Damita Dunnings.  Patient's last menstrual period was 05/29/2022.          Sexually active: yes but not currently.  Recent out of relationship. The current method of family planning is abstinence.     Patient has been counseled about results and procedure.  Risks and benefits have bene reviewed including immediate and/or delayed bleeding, infection, cervical scaring from procedure, possibility of needing additional follow up as well as treatment.  Rare risks of missing a lesion discussed as well.  All questions answered.  Pt ready to proceed.  Consent obtained.  LMP 05/29/2022   General appearance: alert, cooperative and appears stated age Lymph nodes: No abnormal inguinal nodes palpated Neurologic: Grossly normal  Pelvic: External genitalia:  no lesions              Urethra:  normal appearing urethra with no masses, tenderness or lesions              Bartholins and Skenes: normal                 Vagina: normal appearing vagina with normal color and no discharge, no lesions               Physical Exam Exam conducted with a chaperone present.  Constitutional:      Appearance: Normal appearance.  Genitourinary:    Labia:        Right: No rash, tenderness or lesion.        Left: No rash, tenderness or lesion.      Lymphadenopathy:     Lower Body: No right inguinal adenopathy. No left inguinal adenopathy.  Neurological:     Mental Status: She is alert.     Speculum placed.  3% acetic acid applied to cervix for >45 seconds.  Cervix visualized with both 7.5X and 15X magnification.  Green filter also used.  Lugols solution was not used.  Findings:  AWE exteding above TZ.  Biopsy:  12 o'clock.  ECC:  was performed.    Then cervix cleansed with betadine x 3.  Single toothed tenaculum applied to anterior lip of cervix.  Endometrial pipelle passed through cervixl os and into endometrial cavity to about 7.5cm.  Suction applied and good tissue sample obtained with pipelle.  This was removed as well as tenaculum.  Then monsel's was applied to biopsy site.  Excellent hemostasis was present.  Pt tolerated procedure well and all instruments were removed.  Findings noted above on picture of cervix.  Chaperone, Ezekiel Ina, RN, was present during procedure.  Assessment/Plan: 1. ASCUS with positive high risk HPV cervical - Surgical pathology( [place]/ [facility])  2. Irregular menstrual bleeding - Surgical pathology( [place]/ [facility])  - Pathology results will be called to patient and follow-up planned pending results.

## 2022-07-03 LAB — SURGICAL PATHOLOGY

## 2022-07-04 ENCOUNTER — Other Ambulatory Visit (HOSPITAL_BASED_OUTPATIENT_CLINIC_OR_DEPARTMENT_OTHER): Payer: Self-pay | Admitting: *Deleted

## 2022-07-04 MED ORDER — NORETHINDRONE 0.35 MG PO TABS: 1.0000 | ORAL_TABLET | Freq: Every day | ORAL | 2 refills | Status: DC

## 2022-07-06 DIAGNOSIS — F4323 Adjustment disorder with mixed anxiety and depressed mood: Secondary | ICD-10-CM | POA: Diagnosis not present

## 2022-07-20 ENCOUNTER — Other Ambulatory Visit (HOSPITAL_COMMUNITY): Payer: Self-pay

## 2022-07-21 ENCOUNTER — Other Ambulatory Visit: Payer: Self-pay

## 2022-07-21 ENCOUNTER — Other Ambulatory Visit (HOSPITAL_COMMUNITY): Payer: Self-pay

## 2022-07-22 ENCOUNTER — Other Ambulatory Visit (HOSPITAL_COMMUNITY): Payer: Self-pay

## 2022-07-27 DIAGNOSIS — F4323 Adjustment disorder with mixed anxiety and depressed mood: Secondary | ICD-10-CM | POA: Diagnosis not present

## 2022-07-30 ENCOUNTER — Other Ambulatory Visit (HOSPITAL_COMMUNITY): Payer: Self-pay

## 2022-08-02 DIAGNOSIS — L738 Other specified follicular disorders: Secondary | ICD-10-CM | POA: Diagnosis not present

## 2022-08-02 DIAGNOSIS — L700 Acne vulgaris: Secondary | ICD-10-CM | POA: Diagnosis not present

## 2022-08-09 ENCOUNTER — Other Ambulatory Visit (HOSPITAL_COMMUNITY): Payer: Self-pay

## 2022-08-09 DIAGNOSIS — F411 Generalized anxiety disorder: Secondary | ICD-10-CM | POA: Diagnosis not present

## 2022-08-09 DIAGNOSIS — Z79899 Other long term (current) drug therapy: Secondary | ICD-10-CM | POA: Diagnosis not present

## 2022-08-09 DIAGNOSIS — Z8639 Personal history of other endocrine, nutritional and metabolic disease: Secondary | ICD-10-CM | POA: Diagnosis not present

## 2022-08-09 DIAGNOSIS — Z6824 Body mass index (BMI) 24.0-24.9, adult: Secondary | ICD-10-CM | POA: Diagnosis not present

## 2022-08-09 MED ORDER — ZEPBOUND 12.5 MG/0.5ML ~~LOC~~ SOAJ
12.5000 mg | SUBCUTANEOUS | 0 refills | Status: DC
  Filled 2022-08-09 – 2022-08-15 (×2): qty 2, 28d supply, fill #0

## 2022-08-10 ENCOUNTER — Other Ambulatory Visit (HOSPITAL_BASED_OUTPATIENT_CLINIC_OR_DEPARTMENT_OTHER): Payer: Self-pay

## 2022-08-15 ENCOUNTER — Other Ambulatory Visit (HOSPITAL_COMMUNITY): Payer: Self-pay

## 2022-08-15 ENCOUNTER — Other Ambulatory Visit: Payer: Self-pay

## 2022-08-16 ENCOUNTER — Other Ambulatory Visit (HOSPITAL_COMMUNITY): Payer: Self-pay

## 2022-08-17 DIAGNOSIS — F4323 Adjustment disorder with mixed anxiety and depressed mood: Secondary | ICD-10-CM | POA: Diagnosis not present

## 2022-08-17 DIAGNOSIS — F419 Anxiety disorder, unspecified: Secondary | ICD-10-CM | POA: Diagnosis not present

## 2022-08-17 DIAGNOSIS — F341 Dysthymic disorder: Secondary | ICD-10-CM | POA: Diagnosis not present

## 2022-08-17 DIAGNOSIS — F5081 Binge eating disorder: Secondary | ICD-10-CM | POA: Diagnosis not present

## 2022-08-25 DIAGNOSIS — F4323 Adjustment disorder with mixed anxiety and depressed mood: Secondary | ICD-10-CM | POA: Diagnosis not present

## 2022-09-06 ENCOUNTER — Other Ambulatory Visit (HOSPITAL_COMMUNITY): Payer: Self-pay

## 2022-09-07 ENCOUNTER — Other Ambulatory Visit (HOSPITAL_COMMUNITY): Payer: Self-pay

## 2022-09-07 MED ORDER — ZEPBOUND 12.5 MG/0.5ML ~~LOC~~ SOAJ
SUBCUTANEOUS | 0 refills | Status: DC
  Filled 2022-09-07: qty 2, 28d supply, fill #0

## 2022-09-08 ENCOUNTER — Other Ambulatory Visit (HOSPITAL_COMMUNITY): Payer: Self-pay

## 2022-09-08 DIAGNOSIS — F4323 Adjustment disorder with mixed anxiety and depressed mood: Secondary | ICD-10-CM | POA: Diagnosis not present

## 2022-09-10 ENCOUNTER — Other Ambulatory Visit (HOSPITAL_COMMUNITY): Payer: Self-pay

## 2022-10-03 ENCOUNTER — Other Ambulatory Visit (HOSPITAL_COMMUNITY): Payer: Self-pay

## 2022-10-03 MED ORDER — ZEPBOUND 12.5 MG/0.5ML ~~LOC~~ SOAJ
12.5000 mg | SUBCUTANEOUS | 0 refills | Status: DC
  Filled 2022-10-03: qty 2, 28d supply, fill #0

## 2022-10-24 ENCOUNTER — Other Ambulatory Visit (HOSPITAL_COMMUNITY): Payer: Self-pay

## 2022-10-24 DIAGNOSIS — G479 Sleep disorder, unspecified: Secondary | ICD-10-CM | POA: Diagnosis not present

## 2022-10-24 DIAGNOSIS — Z8639 Personal history of other endocrine, nutritional and metabolic disease: Secondary | ICD-10-CM | POA: Diagnosis not present

## 2022-10-24 DIAGNOSIS — Z79899 Other long term (current) drug therapy: Secondary | ICD-10-CM | POA: Diagnosis not present

## 2022-10-24 DIAGNOSIS — Z6823 Body mass index (BMI) 23.0-23.9, adult: Secondary | ICD-10-CM | POA: Diagnosis not present

## 2022-10-24 MED ORDER — ZEPBOUND 12.5 MG/0.5ML ~~LOC~~ SOAJ
SUBCUTANEOUS | 0 refills | Status: DC
  Filled 2022-10-24: qty 2, 28d supply, fill #0

## 2022-10-25 DIAGNOSIS — F4323 Adjustment disorder with mixed anxiety and depressed mood: Secondary | ICD-10-CM | POA: Diagnosis not present

## 2022-11-08 DIAGNOSIS — F411 Generalized anxiety disorder: Secondary | ICD-10-CM | POA: Diagnosis not present

## 2022-11-08 DIAGNOSIS — F5081 Binge eating disorder: Secondary | ICD-10-CM | POA: Diagnosis not present

## 2022-11-08 DIAGNOSIS — F341 Dysthymic disorder: Secondary | ICD-10-CM | POA: Diagnosis not present

## 2022-11-24 DIAGNOSIS — F4323 Adjustment disorder with mixed anxiety and depressed mood: Secondary | ICD-10-CM | POA: Diagnosis not present

## 2022-11-30 DIAGNOSIS — F4323 Adjustment disorder with mixed anxiety and depressed mood: Secondary | ICD-10-CM | POA: Diagnosis not present

## 2022-12-02 ENCOUNTER — Telehealth: Payer: BC Managed Care – PPO | Admitting: Family Medicine

## 2022-12-02 DIAGNOSIS — N390 Urinary tract infection, site not specified: Secondary | ICD-10-CM | POA: Diagnosis not present

## 2022-12-02 MED ORDER — SULFAMETHOXAZOLE-TRIMETHOPRIM 800-160 MG PO TABS: 1.0000 | ORAL_TABLET | Freq: Two times a day (BID) | ORAL | 0 refills | Status: AC

## 2022-12-02 NOTE — Progress Notes (Signed)
E-Visit for Urinary Problems  We are sorry that you are not feeling well.  Here is how we plan to help!  Based on what you shared with me it looks like you most likely have a simple urinary tract infection.  A UTI (Urinary Tract Infection) is a bacterial infection of the bladder.  Most cases of urinary tract infections are simple to treat but a key part of your care is to encourage you to drink plenty of fluids and watch your symptoms carefully.  I have prescribed Bactrim DS One tablet twice a day for 5 days.  Your symptoms should gradually improve. Call us if the burning in your urine worsens, you develop worsening fever, back pain or pelvic pain or if your symptoms do not resolve after completing the antibiotic.  Urinary tract infections can be prevented by drinking plenty of water to keep your body hydrated.  Also be sure when you wipe, wipe from front to back and don't hold it in!  If possible, empty your bladder every 4 hours.  HOME CARE Drink plenty of fluids Compete the full course of the antibiotics even if the symptoms resolve Remember, when you need to go.go. Holding in your urine can increase the likelihood of getting a UTI! GET HELP RIGHT AWAY IF: You cannot urinate You get a high fever Worsening back pain occurs You see blood in your urine You feel sick to your stomach or throw up You feel like you are going to pass out  MAKE SURE YOU  Understand these instructions. Will watch your condition. Will get help right away if you are not doing well or get worse.   Thank you for choosing an e-visit.  Your e-visit answers were reviewed by a board certified advanced clinical practitioner to complete your personal care plan. Depending upon the condition, your plan could have included both over the counter or prescription medications.  Please review your pharmacy choice. Make sure the pharmacy is open so you can pick up prescription now. If there is a problem, you may contact  your provider through MyChart messaging and have the prescription routed to another pharmacy.  Your safety is important to us. If you have drug allergies check your prescription carefully.   For the next 24 hours you can use MyChart to ask questions about today's visit, request a non-urgent call back, or ask for a work or school excuse. You will get an email in the next two days asking about your experience. I hope that your e-visit has been valuable and will speed your recovery.   have provided 5 minutes of non face to face time during this encounter for chart review and documentation.    

## 2022-12-07 DIAGNOSIS — F4323 Adjustment disorder with mixed anxiety and depressed mood: Secondary | ICD-10-CM | POA: Diagnosis not present

## 2022-12-21 DIAGNOSIS — F4323 Adjustment disorder with mixed anxiety and depressed mood: Secondary | ICD-10-CM | POA: Diagnosis not present

## 2022-12-27 DIAGNOSIS — R635 Abnormal weight gain: Secondary | ICD-10-CM | POA: Diagnosis not present

## 2022-12-27 DIAGNOSIS — Z6823 Body mass index (BMI) 23.0-23.9, adult: Secondary | ICD-10-CM | POA: Diagnosis not present

## 2022-12-27 DIAGNOSIS — Z8639 Personal history of other endocrine, nutritional and metabolic disease: Secondary | ICD-10-CM | POA: Diagnosis not present

## 2022-12-27 DIAGNOSIS — R632 Polyphagia: Secondary | ICD-10-CM | POA: Diagnosis not present

## 2022-12-28 ENCOUNTER — Other Ambulatory Visit (HOSPITAL_COMMUNITY): Payer: Self-pay

## 2022-12-28 DIAGNOSIS — F4323 Adjustment disorder with mixed anxiety and depressed mood: Secondary | ICD-10-CM | POA: Diagnosis not present

## 2022-12-28 MED ORDER — ZEPBOUND 15 MG/0.5ML ~~LOC~~ SOAJ
15.0000 mg | SUBCUTANEOUS | 1 refills | Status: DC
  Filled 2022-12-28: qty 2, 28d supply, fill #0
  Filled 2023-01-19: qty 2, 28d supply, fill #1

## 2022-12-29 ENCOUNTER — Other Ambulatory Visit (HOSPITAL_COMMUNITY): Payer: Self-pay

## 2023-01-04 DIAGNOSIS — F419 Anxiety disorder, unspecified: Secondary | ICD-10-CM | POA: Diagnosis not present

## 2023-01-04 DIAGNOSIS — F341 Dysthymic disorder: Secondary | ICD-10-CM | POA: Diagnosis not present

## 2023-01-04 DIAGNOSIS — F4323 Adjustment disorder with mixed anxiety and depressed mood: Secondary | ICD-10-CM | POA: Diagnosis not present

## 2023-01-04 DIAGNOSIS — F5081 Binge eating disorder, mild: Secondary | ICD-10-CM | POA: Diagnosis not present

## 2023-01-10 DIAGNOSIS — F4323 Adjustment disorder with mixed anxiety and depressed mood: Secondary | ICD-10-CM | POA: Diagnosis not present

## 2023-01-17 ENCOUNTER — Telehealth: Payer: BC Managed Care – PPO | Admitting: Physician Assistant

## 2023-01-17 DIAGNOSIS — M62838 Other muscle spasm: Secondary | ICD-10-CM | POA: Diagnosis not present

## 2023-01-17 DIAGNOSIS — M549 Dorsalgia, unspecified: Secondary | ICD-10-CM | POA: Diagnosis not present

## 2023-01-17 MED ORDER — CYCLOBENZAPRINE HCL 10 MG PO TABS: 10.0000 mg | ORAL_TABLET | Freq: Three times a day (TID) | ORAL | 0 refills | Status: DC | PRN

## 2023-01-17 NOTE — Progress Notes (Signed)
I have spent 5 minutes in review of e-visit questionnaire, review and updating patient chart, medical decision making and response to patient.   Mia Milan Cody Jacklynn Dehaas, PA-C    

## 2023-01-17 NOTE — Progress Notes (Signed)

## 2023-01-20 ENCOUNTER — Other Ambulatory Visit: Payer: Self-pay

## 2023-01-20 ENCOUNTER — Other Ambulatory Visit (HOSPITAL_COMMUNITY): Payer: Self-pay

## 2023-01-26 DIAGNOSIS — L648 Other androgenic alopecia: Secondary | ICD-10-CM | POA: Diagnosis not present

## 2023-01-26 DIAGNOSIS — F4323 Adjustment disorder with mixed anxiety and depressed mood: Secondary | ICD-10-CM | POA: Diagnosis not present

## 2023-02-01 DIAGNOSIS — F419 Anxiety disorder, unspecified: Secondary | ICD-10-CM | POA: Diagnosis not present

## 2023-02-01 DIAGNOSIS — F341 Dysthymic disorder: Secondary | ICD-10-CM | POA: Diagnosis not present

## 2023-02-02 ENCOUNTER — Other Ambulatory Visit: Payer: Self-pay | Admitting: Medical Genetics

## 2023-02-02 DIAGNOSIS — Z006 Encounter for examination for normal comparison and control in clinical research program: Secondary | ICD-10-CM

## 2023-02-12 ENCOUNTER — Other Ambulatory Visit (HOSPITAL_COMMUNITY): Payer: Self-pay

## 2023-02-13 ENCOUNTER — Other Ambulatory Visit (HOSPITAL_COMMUNITY): Payer: Self-pay

## 2023-02-13 MED ORDER — ZEPBOUND 15 MG/0.5ML ~~LOC~~ SOAJ
15.0000 mg | SUBCUTANEOUS | 1 refills | Status: DC
  Filled 2023-02-13: qty 2, 28d supply, fill #0
  Filled 2023-03-10: qty 2, 28d supply, fill #1

## 2023-02-14 ENCOUNTER — Other Ambulatory Visit (HOSPITAL_COMMUNITY): Payer: Self-pay

## 2023-02-17 ENCOUNTER — Other Ambulatory Visit (HOSPITAL_COMMUNITY): Payer: BC Managed Care – PPO | Attending: Medical Genetics

## 2023-02-21 ENCOUNTER — Other Ambulatory Visit (HOSPITAL_COMMUNITY): Payer: Self-pay

## 2023-02-21 DIAGNOSIS — Z6823 Body mass index (BMI) 23.0-23.9, adult: Secondary | ICD-10-CM | POA: Diagnosis not present

## 2023-02-21 DIAGNOSIS — Z8639 Personal history of other endocrine, nutritional and metabolic disease: Secondary | ICD-10-CM | POA: Diagnosis not present

## 2023-02-21 DIAGNOSIS — Z79899 Other long term (current) drug therapy: Secondary | ICD-10-CM | POA: Diagnosis not present

## 2023-02-21 DIAGNOSIS — F439 Reaction to severe stress, unspecified: Secondary | ICD-10-CM | POA: Diagnosis not present

## 2023-02-21 MED ORDER — ZEPBOUND 15 MG/0.5ML ~~LOC~~ SOAJ
15.0000 mg | SUBCUTANEOUS | 0 refills | Status: DC
  Filled 2023-02-21 – 2023-04-09 (×2): qty 2, 28d supply, fill #0

## 2023-02-22 ENCOUNTER — Other Ambulatory Visit (HOSPITAL_COMMUNITY): Payer: Self-pay

## 2023-02-24 ENCOUNTER — Encounter (HOSPITAL_BASED_OUTPATIENT_CLINIC_OR_DEPARTMENT_OTHER): Payer: Self-pay | Admitting: Certified Nurse Midwife

## 2023-02-24 ENCOUNTER — Ambulatory Visit (HOSPITAL_BASED_OUTPATIENT_CLINIC_OR_DEPARTMENT_OTHER): Payer: BC Managed Care – PPO | Admitting: Certified Nurse Midwife

## 2023-02-24 VITALS — BP 122/68 | HR 81 | Ht 68.5 in | Wt 147.4 lb

## 2023-02-24 DIAGNOSIS — G479 Sleep disorder, unspecified: Secondary | ICD-10-CM

## 2023-02-24 DIAGNOSIS — N951 Menopausal and female climacteric states: Secondary | ICD-10-CM | POA: Diagnosis not present

## 2023-02-24 MED ORDER — PROGESTERONE 200 MG PO CAPS: 200.0000 mg | ORAL_CAPSULE | Freq: Every day | ORAL | 1 refills | Status: DC

## 2023-02-24 NOTE — Progress Notes (Signed)
  Subjective:     Nancy Patel is a 47 y.o. female here for problem gyn visit. Pt reports that she wakes up every night, usually around ?3am, and cannot get back to sleep. She takes Gabapentin 300mg  at bedtime. Takes Mirapex and Remeron. States she doesn't have trouble falling asleep, but once she wakes up she is unable to get back to sleep. Sometimes she feels hot when she wakes up, but not 100% of the time. She denies hot flashes during the day. She did not have period complaints today (not on any progesterone). She has tried many OTC options including Melatonin, Magnesium, Benadryl-none of these have been helpful.   Pt has lost at least 80lb over the past ~year (intentional weight loss).  The following portions of the patient's history were reviewed and updated as appropriate: allergies, current medications, past family history, past medical history, past social history, past surgical history, and problem list.   Review of Systems Pertinent items are noted in HPI.    Objective:    BP 122/68 (BP Location: Right Arm, Patient Position: Sitting, Cuff Size: Normal)   Pulse 81   Ht 5' 8.5" (1.74 m)   Wt 147 lb 6.4 oz (66.9 kg)   LMP 02/10/2023 (Exact Date)   BMI 22.09 kg/m  General appearance: alert, cooperative, and appears stated age    Assessment:    Trouble Sleeping .    Plan:   Long discussion regarding difficulty sleeping through the night. Pt aware this may or may not be a perimenopausal complaint. She is already taking Gabapentin 300mg  po at bedtime. Pt agreeable to trial of Progesterone 200mg  po at bedtime in effort to see if this will promote restful sleep. She is to message [place] in one month with an update. Pt will ask Primary Care Provider if he or she feels sleep study needed.  RTO April 2025 for annual gyn exam with repeat follow-up pap smear. Letta Kocher

## 2023-02-25 LAB — PROGESTERONE: Progesterone: 0.4 ng/mL

## 2023-03-07 ENCOUNTER — Encounter (HOSPITAL_BASED_OUTPATIENT_CLINIC_OR_DEPARTMENT_OTHER): Payer: Self-pay | Admitting: Certified Nurse Midwife

## 2023-03-07 ENCOUNTER — Other Ambulatory Visit (HOSPITAL_BASED_OUTPATIENT_CLINIC_OR_DEPARTMENT_OTHER): Payer: Self-pay | Admitting: Certified Nurse Midwife

## 2023-03-07 DIAGNOSIS — G479 Sleep disorder, unspecified: Secondary | ICD-10-CM

## 2023-03-07 MED ORDER — PROGESTERONE 200 MG PO CAPS: 200.0000 mg | ORAL_CAPSULE | Freq: Every day | ORAL | 3 refills | Status: DC

## 2023-03-10 ENCOUNTER — Other Ambulatory Visit (HOSPITAL_COMMUNITY): Payer: Self-pay

## 2023-03-10 ENCOUNTER — Other Ambulatory Visit: Payer: Self-pay

## 2023-03-13 ENCOUNTER — Other Ambulatory Visit (HOSPITAL_COMMUNITY): Payer: Self-pay

## 2023-04-09 ENCOUNTER — Other Ambulatory Visit (HOSPITAL_COMMUNITY): Payer: Self-pay

## 2023-04-10 ENCOUNTER — Other Ambulatory Visit (HOSPITAL_COMMUNITY): Payer: Self-pay

## 2023-04-11 ENCOUNTER — Other Ambulatory Visit (HOSPITAL_COMMUNITY): Payer: Self-pay

## 2023-04-12 ENCOUNTER — Other Ambulatory Visit (HOSPITAL_COMMUNITY): Payer: Self-pay

## 2023-04-12 MED ORDER — ZEPBOUND 15 MG/0.5ML ~~LOC~~ SOAJ
15.0000 mg | SUBCUTANEOUS | 0 refills | Status: DC
  Filled 2023-05-07 – 2023-06-07 (×2): qty 2, 28d supply, fill #0

## 2023-04-14 ENCOUNTER — Other Ambulatory Visit (HOSPITAL_COMMUNITY): Payer: Self-pay

## 2023-04-18 ENCOUNTER — Encounter (HOSPITAL_COMMUNITY): Payer: Self-pay

## 2023-04-18 ENCOUNTER — Emergency Department (HOSPITAL_BASED_OUTPATIENT_CLINIC_OR_DEPARTMENT_OTHER): Payer: BC Managed Care – PPO

## 2023-04-18 ENCOUNTER — Emergency Department (HOSPITAL_BASED_OUTPATIENT_CLINIC_OR_DEPARTMENT_OTHER)
Admission: EM | Admit: 2023-04-18 | Discharge: 2023-04-18 | Disposition: A | Discharge status: Home / Self Care | Payer: BC Managed Care – PPO | Attending: Emergency Medicine | Admitting: Emergency Medicine

## 2023-04-18 ENCOUNTER — Ambulatory Visit (HOSPITAL_COMMUNITY)
Admission: EM | Admit: 2023-04-18 | Discharge: 2023-04-18 | Disposition: A | Discharge status: Home / Self Care | Payer: BC Managed Care – PPO | Attending: Family Medicine | Admitting: Family Medicine

## 2023-04-18 ENCOUNTER — Other Ambulatory Visit: Payer: Self-pay

## 2023-04-18 ENCOUNTER — Encounter (HOSPITAL_BASED_OUTPATIENT_CLINIC_OR_DEPARTMENT_OTHER): Payer: Self-pay | Admitting: Emergency Medicine

## 2023-04-18 DIAGNOSIS — R42 Dizziness and giddiness: Secondary | ICD-10-CM

## 2023-04-18 DIAGNOSIS — Z79899 Other long term (current) drug therapy: Secondary | ICD-10-CM | POA: Diagnosis not present

## 2023-04-18 DIAGNOSIS — Z7982 Long term (current) use of aspirin: Secondary | ICD-10-CM | POA: Insufficient documentation

## 2023-04-18 DIAGNOSIS — E871 Hypo-osmolality and hyponatremia: Secondary | ICD-10-CM | POA: Insufficient documentation

## 2023-04-18 DIAGNOSIS — R55 Syncope and collapse: Secondary | ICD-10-CM

## 2023-04-18 DIAGNOSIS — R252 Cramp and spasm: Secondary | ICD-10-CM | POA: Diagnosis not present

## 2023-04-18 DIAGNOSIS — E860 Dehydration: Secondary | ICD-10-CM | POA: Diagnosis not present

## 2023-04-18 LAB — COMPREHENSIVE METABOLIC PANEL
ALT: 8 U/L (ref 0–44)
AST: 14 U/L — ABNORMAL LOW (ref 15–41)
Albumin: 4 g/dL (ref 3.5–5.0)
Alkaline Phosphatase: 44 U/L (ref 38–126)
Anion gap: 6 (ref 5–15)
BUN: 16 mg/dL (ref 6–20)
CO2: 23 mmol/L (ref 22–32)
Calcium: 9.1 mg/dL (ref 8.9–10.3)
Chloride: 100 mmol/L (ref 98–111)
Creatinine, Ser: 0.94 mg/dL (ref 0.44–1.00)
GFR, Estimated: >60 mL/min (ref >60)
Glucose, Bld: 99 mg/dL (ref 70–99)
Potassium: 4.3 mmol/L (ref 3.5–5.1)
Sodium: 129 mmol/L — ABNORMAL LOW (ref 135–145)
Total Bilirubin: 0.6 mg/dL (ref 0.0–1.2)
Total Protein: 5.9 g/dL — ABNORMAL LOW (ref 6.5–8.1)

## 2023-04-18 LAB — CBC WITH DIFFERENTIAL/PLATELET
Abs Immature Granulocytes: 0.05 10*3/uL (ref 0.00–0.07)
Basophils Absolute: 0 10*3/uL (ref 0.0–0.1)
Basophils Relative: 0 %
Eosinophils Absolute: 0 10*3/uL (ref 0.0–0.5)
Eosinophils Relative: 0 %
HCT: 45.5 % (ref 36.0–46.0)
Hemoglobin: 16.2 g/dL — ABNORMAL HIGH (ref 12.0–15.0)
Immature Granulocytes: 0 %
Lymphocytes Relative: 7 %
Lymphs Abs: 1.1 10*3/uL (ref 0.7–4.0)
MCH: 30.1 pg (ref 26.0–34.0)
MCHC: 35.6 g/dL (ref 30.0–36.0)
MCV: 84.6 fL (ref 80.0–100.0)
Monocytes Absolute: 0.3 10*3/uL (ref 0.1–1.0)
Monocytes Relative: 2 %
Neutro Abs: 13.8 10*3/uL — ABNORMAL HIGH (ref 1.7–7.7)
Neutrophils Relative %: 91 %
Platelets: 269 10*3/uL (ref 150–400)
RBC: 5.38 MIL/uL — ABNORMAL HIGH (ref 3.87–5.11)
RDW: 11.3 % — ABNORMAL LOW (ref 11.5–15.5)
WBC: 15.2 10*3/uL — ABNORMAL HIGH (ref 4.0–10.5)
nRBC: 0 % (ref 0.0–0.2)

## 2023-04-18 LAB — MAGNESIUM: Magnesium: 1.6 mg/dL — ABNORMAL LOW (ref 1.7–2.4)

## 2023-04-18 LAB — D-DIMER, QUANTITATIVE: D-Dimer, Quant: >20 ug{FEU}/mL — ABNORMAL HIGH (ref 0.00–0.50)

## 2023-04-18 MED ORDER — MAGNESIUM SULFATE 50 % IJ SOLN
1.0000 g | Freq: Once | INTRAMUSCULAR | Status: AC
  Administered 2023-04-18: 1 g via INTRAVENOUS
  Filled 2023-04-18: qty 2

## 2023-04-18 MED ORDER — LACTATED RINGERS IV BOLUS
1000.0000 mL | Freq: Once | INTRAVENOUS | Status: AC
  Administered 2023-04-18: 1000 mL via INTRAVENOUS

## 2023-04-18 MED ORDER — POTASSIUM CHLORIDE CRYS ER 20 MEQ PO TBCR: 40.0000 meq | EXTENDED_RELEASE_TABLET | Freq: Once | ORAL | Status: DC

## 2023-04-18 MED ORDER — IOHEXOL 350 MG/ML SOLN
100.0000 mL | Freq: Once | INTRAVENOUS | Status: AC | PRN
  Administered 2023-04-18: 60 mL via INTRAVENOUS

## 2023-04-18 MED ORDER — ACETAMINOPHEN 325 MG PO TABS
650.0000 mg | ORAL_TABLET | Freq: Once | ORAL | Status: AC
Start: 2023-04-18 — End: 2023-04-18
  Administered 2023-04-18: 650 mg via ORAL
  Filled 2023-04-18: qty 2

## 2023-04-18 NOTE — ED Triage Notes (Signed)
 Pt states got up around 2:30-3am to use the bathroom and passed out and hit her head on the floor. States now she is unsteady and dizzy. Provider at bedside.

## 2023-04-18 NOTE — Discharge Instructions (Signed)
 You were seen in the emergency department for your dizziness.  You did appear dehydrated as your sodium levels were mildly low as well as your magnesium  levels.  We did give you fluids in the emergency department and magnesium .  You should make sure that you are drinking plenty of fluids to stay well-hydrated and should follow-up with your primary doctor to have your symptoms and your electrolyte levels rechecked.  You should return to the emergency department if you have recurrent episodes of passing out, you develop severe chest pain or shortness of breath or if you have any other new or concerning symptoms.

## 2023-04-18 NOTE — ED Provider Notes (Signed)
 Patient is here today for headache and dizziness.  She states that yesterday she felt constipated, and does not think she drank enough water.  She took a dulcolax last night.  Got up in the middle of the night, she felt dizzy and fell.  Hit the back of her head, but had several Bms after that. She reports she continues to feel dizzy, light headed, with cramping in her legs.  She states she really cannot walk due to the cramping and dizziness.  Patient is awake and alert, answering questions.  VSS  Patient will be taken to the ER via private vehicle.  She is aware and agrees.    Darral Longs, MD 04/18/23 [phone]

## 2023-04-18 NOTE — ED Triage Notes (Signed)
 Pt referred by UC, endorses dizziness starting at ~ 0300, reports fall with head injury, posterior head . Pt c/o diarrhea. Pt also endorses BLE "crampiness". Pt unsure of +loc. Pt aox4

## 2023-04-18 NOTE — ED Notes (Signed)
 Pt reported a very minimal increase in dizziness while performing orthostatics.

## 2023-04-18 NOTE — ED Notes (Signed)
 Patient is being discharged from the Urgent Care and sent to the Emergency Department via POV . Per Dr. Darral, patient is in need of higher level of care due to LOC. Patient is aware and verbalizes understanding of plan of care.  Vitals:   04/18/23 0908  BP: 124/80  Pulse: (!) 119  Resp: 20  Temp: 98.2 F (36.8 C)  SpO2: 100%

## 2023-04-18 NOTE — ED Provider Notes (Signed)
 [place] EMERGENCY DEPARTMENT AT [facility] Provider Note   CSN: [id number] Arrival date & time: 04/18/23  9063     History  Chief Complaint  Patient presents with   Fall   Dizziness    Nancy Patel is a 48 y.o. female.  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  Patient states that she had been constipated for the last several days and took senna last night for the first time.  She states that she woke up in the middle the night to go to the bathroom and on her way to the bathroom became very lightheaded and passed out.  She states that she did have 1 normal bowel movement and 1 episode of diarrhea.  She states that she has continued to feel dizzy throughout the morning both at rest and while standing but states that she cannot stand for very long without feeling like she is going to pass out.  She states that she has also had cramping in her legs this morning, denies any leg swelling.  She states that she did not drink enough water yesterday but otherwise was feeling normal yesterday.  She states that she did hit the back of her head and has some mild discomfort.  She denies any current blood thinners, no nausea or vomiting since hitting her head, no numbness or weakness.  She denies any other pain or injuries from the fall.  She denies any recent chest pain or shortness of breath.  The history is provided by the patient.  Fall  Dizziness      Home Medications Prior to Admission medications   Medication Sig Start Date End Date Taking? Authorizing Provider  aspirin 325 MG tablet Take 325 mg by mouth daily.    [provider]  buPROPion  (WELLBUTRIN  XL) 300 MG 24 hr tablet Take 150 mg by mouth daily. 03/05/20   [provider]  Cholecalciferol (VITAMIN D3) 1.25 MG (50000 UT) CAPS Take 1 capsule by mouth once a week. 04/16/20    [provider]  cyclobenzaprine  (FLEXERIL ) 10 MG tablet Take 1 tablet (10 mg total) by mouth 3 (three) times daily as needed. 01/17/23   Gladis Elsie BROCKS, PA-C  ferrous sulfate 325 (65 FE) MG tablet Take by mouth. 03/02/20   [provider]  gabapentin (NEURONTIN) 300 MG capsule  05/06/21   [provider]  metoprolol  tartrate (LOPRESSOR ) 25 MG tablet EVERY 12 HOURS AS NEEDED FOR PALPITATIONS 06/18/20   Raford Riggs, MD  mirtazapine  (REMERON ) 15 MG tablet Take 15 mg by mouth at bedtime. 03/06/20   [provider]  progesterone  (PROMETRIUM ) 200 MG capsule Take 1 capsule (200 mg total) by mouth daily. 03/07/23   Lo, Arland POUR, CNM  tirzepatide  (MOUNJARO ) 12.5 MG/0.5ML Pen Inject 12.5 mg into the skin once a week. 05/31/22     tirzepatide  (ZEPBOUND ) 15 MG/0.5ML Pen Inject 15 mg into the skin once a week. 02/13/23     tirzepatide  (ZEPBOUND ) 15 MG/0.5ML Pen Inject 15 mg into the skin once a week. 02/21/23     tirzepatide  (ZEPBOUND ) 15 MG/0.5ML Pen Inject 15 mg into the skin once a week. 04/11/23     tretinoin  (RETIN-A ) 0.025 % gel Apply topically at bedtime. 10/08/21   Willo Mini, NP  valACYclovir  (VALTREX ) 500 MG tablet Take 500 mg by mouth as needed. 10/23/19   [provider]      Allergies    Patient has no known allergies.    Review of Systems   Review of Systems  Neurological:  Positive for dizziness.    Physical Exam Updated Vital Signs BP (!) 120/90   Pulse (!) 115   Temp 98 F (36.7 C) (Oral)   Resp 17   LMP 03/14/2023   SpO2 99%  Physical Exam Vitals and nursing note reviewed.  Constitutional:      General: She is not in acute distress.    Appearance: Normal appearance.  HENT:     Head: Normocephalic and atraumatic.     Nose: Nose normal.     Mouth/Throat:     Mouth: Mucous membranes are moist.     Pharynx: Oropharynx is clear.  Eyes:     Extraocular Movements: Extraocular movements intact.     Conjunctiva/sclera: Conjunctivae  normal.     Pupils: Pupils are equal, round, and reactive to light.  Neck:     Comments: No midline neck tenderness Cardiovascular:     Rate and Rhythm: Regular rhythm. Tachycardia present.     Pulses: Normal pulses.     Heart sounds: Normal heart sounds.  Pulmonary:     Effort: Pulmonary effort is normal.     Breath sounds: Normal breath sounds.  Abdominal:     General: Abdomen is flat.     Palpations: Abdomen is soft.     Tenderness: There is no abdominal tenderness.  Musculoskeletal:        General: Normal range of motion.     Cervical back: Normal range of motion and neck supple.     Right lower leg: No edema.     Left lower leg: No edema.     Comments: No midline back tenderness No bony tenderness to bilateral UE and LE  Skin:    General: Skin is warm and dry.  Neurological:     General: No focal deficit present.     Mental Status: She is alert and oriented to person, place, and time.     Cranial Nerves: No cranial nerve deficit.     Sensory: No sensory deficit.     Motor: No weakness.  Psychiatric:        Mood and Affect: Mood normal.        Behavior: Behavior normal.     ED Results / Procedures / Treatments   Labs (all labs ordered are listed, but only abnormal results are displayed) Labs Reviewed  CBC WITH DIFFERENTIAL/PLATELET - Abnormal; Notable for the following components:      Result Value   WBC 15.2 (*)    RBC 5.38 (*)    Hemoglobin 16.2 (*)    RDW 11.3 (*)    Neutro Abs 13.8 (*)    All other components within normal limits  D-DIMER, QUANTITATIVE - Abnormal; Notable for the following components:   D-Dimer, Quant >20.00 (*)    All other components within normal limits  COMPREHENSIVE METABOLIC PANEL - Abnormal; Notable for the following components:   Sodium 129 (*)    Total Protein 5.9 (*)    AST 14 (*)    All other components within normal limits  MAGNESIUM  - Abnormal; Notable for the following components:   Magnesium  1.6 (*)    All other  components within normal limits    EKG EKG Interpretation Date/Time:  Tuesday April 18 2023 10:04:41 EST Ventricular Rate:  103 PR Interval:  132 QRS Duration:  88 QT  Interval:  336 QTC Calculation: 440 R Axis:   44  Text Interpretation: Sinus tachycardia No significant change since last tracing Confirmed by Ellouise Fine ([id number]) on 04/18/2023 10:05:48 AM  Radiology CT Angio Chest PE W/Cm &/Or Wo Cm Result Date: 04/18/2023 CLINICAL DATA:  Pulmonary embolism (PE) suspected, low to intermediate prob, positive D-dimer. Fall with head injury. Bilateral lower extremity crampiness. EXAM: CT ANGIOGRAPHY CHEST WITH CONTRAST TECHNIQUE: Multidetector CT imaging of the chest was performed using the standard protocol during bolus administration of intravenous contrast. Multiplanar CT image reconstructions and MIPs were obtained to evaluate the vascular anatomy. RADIATION DOSE REDUCTION: This exam was performed according to the departmental dose-optimization program which includes automated exposure control, adjustment of the mA and/or kV according to patient size and/or use of iterative reconstruction technique. CONTRAST:  60mL OMNIPAQUE  IOHEXOL  350 MG/ML SOLN COMPARISON:  CT angiography chest from 02/13/2020. FINDINGS: Cardiovascular: No evidence of embolism to the proximal subsegmental pulmonary artery level. Normal cardiac size. No pericardial effusion. No aortic aneurysm. Mediastinum/Nodes: Visualized thyroid  gland appears grossly unremarkable. No solid / cystic mediastinal masses. The esophagus is nondistended precluding optimal assessment. No axillary, mediastinal or hilar lymphadenopathy by size criteria. Lungs/Pleura: The central tracheo-bronchial tree is patent. There are dependent changes in bilateral lungs. No mass or consolidation. No pleural effusion or pneumothorax. No suspicious lung nodules. Upper Abdomen: Visualized upper abdominal viscera within normal limits. Musculoskeletal: The  visualized soft tissues of the chest wall are grossly unremarkable. No suspicious osseous lesions. Review of the MIP images confirms the above findings. IMPRESSION: 1. No embolism to the proximal subsegmental pulmonary artery level. 2. No lung mass, consolidation, pleural effusion or pneumothorax. Electronically Signed   By: Ree Molt M.D.   On: 04/18/2023 14:28    Procedures Procedures    Medications Ordered in ED Medications  lactated ringers  bolus 1,000 mL (0 mLs Intravenous Stopped 04/18/23 1226)  acetaminophen  (TYLENOL ) tablet 650 mg (650 mg Oral Given 04/18/23 1044)  iohexol  (OMNIPAQUE ) 350 MG/ML injection 100 mL (60 mLs Intravenous Contrast Given 04/18/23 1354)  magnesium  sulfate (IV Push/IM) injection 1 g (1 g Intravenous Given 04/18/23 1445)    ED Course/ Medical Decision Making/ A&P Clinical Course as of 04/18/23 1505  Tue Apr 18, 2023  1204 Leukocytosis, though Hgb also increased, possible hemoconcentration. No infectious symptoms. [VK]  1221 D-dimer elevated, will need CTPE study. [VK]  1353 Labs with mild hyponatremia and hypomagnesemia. She has received LR and mag will be repleted. CT is pending. [VK]  1434 No acute disease on CTPE. Positive orthostatics HR wise but BP stable. [VK]  1502 Patient still reports some dizziness but otherwise feels overall improved.  She is stable for discharge home.  Was recommended to increase fluid intake and for close primary care follow-up.  She was given strict return precautions. 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 The complaint involves an extensive differential diagnosis and also carries with it a high risk of complications and morbidity.     The differential diagnosis includes arrhythmia, anemia, dehydration, electrolyte abnormality, considering PE with her prior  history, orthostatic hypotension, hypo or hyperglycemia   Additional history obtained: Additional history obtained from N/A Records reviewed Primary Care Documents and urgent care records  ED Course and Reassessment: On patient's arrival she was initially mildly tachycardic otherwise hemodynamically stable in no acute distress.  No significant signs of head trauma and she is low risk by [place] head CT rule making ICH or mass effect unlikely.  EKG on arrival showed sinus tachycardia without acute ischemic changes.  With patient's prior history of PE and tachycardia, will D-dimer in addition to labs and electrolytes.  She will be started on IV fluids.  Will of orthostatics performed and will be closely reassessed.  Independent labs interpretation:  The following labs were independently interpreted: leukocytosis and increased hgb, likely related to hemoconcentration, hyponatremia, hypomagnesemia, elevated d-dimer  Independent visualization of imaging: - I independently visualized the following imaging with scope of interpretation limited to determining acute life threatening conditions related to emergency care: CTPE, which revealed no evidence of PE or acute disease  Consultation: - Consulted or discussed management/test interpretation w/ external professional: N/A  Consideration for admission or further workup: Patient has no emergent conditions requiring admission or further work-up at this time and is stable for discharge home with primary care follow-up  Social Determinants of health: N/A    Amount and/or Complexity of Data Reviewed Labs: ordered. Radiology: ordered.  Risk OTC drugs. Prescription drug management.          Final Clinical Impression(s) / ED Diagnoses Final diagnoses:  Dizziness  Dehydration  Hyponatremia  Hypomagnesemia     Rx / DC Orders ED Discharge Orders     None         Kingsley, Tniya Bowditch K, DO 04/18/23 1505

## 2023-04-20 ENCOUNTER — Ambulatory Visit: Payer: BC Managed Care – PPO

## 2023-04-20 ENCOUNTER — Ambulatory Visit: Payer: BC Managed Care – PPO | Admitting: Medical-Surgical

## 2023-04-20 ENCOUNTER — Encounter: Payer: Self-pay | Admitting: Medical-Surgical

## 2023-04-20 VITALS — BP 128/81 | HR 67 | Resp 20 | Ht 68.5 in | Wt 150.2 lb

## 2023-04-20 DIAGNOSIS — S0990XD Unspecified injury of head, subsequent encounter: Secondary | ICD-10-CM | POA: Diagnosis not present

## 2023-04-20 DIAGNOSIS — R29818 Other symptoms and signs involving the nervous system: Secondary | ICD-10-CM

## 2023-04-20 DIAGNOSIS — E860 Dehydration: Secondary | ICD-10-CM

## 2023-04-20 DIAGNOSIS — R519 Headache, unspecified: Secondary | ICD-10-CM

## 2023-04-20 DIAGNOSIS — Z09 Encounter for follow-up examination after completed treatment for conditions other than malignant neoplasm: Secondary | ICD-10-CM

## 2023-04-20 NOTE — Progress Notes (Signed)
        Established patient visit  History, exam, impression, and plan:  1. Hospital discharge follow-up (Primary) 2. Injury of head, subsequent encounter 3. Headache with neurologic deficit Pleasant 48 year old female presenting today for hospital discharge follow up after being evaluated for syncope, fall, and head trauma in the ED on 04/18/23.  Reports that she went to bed feeling fine that night however when she woke up to go to the bathroom, she got dizzy and fainted.  She had complete loss of consciousness with the fall to the floor.  She hit her head but is not sure what she hit it on.  She has some bleeding of the scalp but this was minimal.  She was taken to urgent care however they did not feel they could manage her symptoms appropriately and she was sent to the ED.  In the ED, she had a significantly elevated D-dimer which was suspected to be secondary to dehydration.  CT angio chest negative for PE.  No imaging of the head or neck at that time.  She was given IV fluids and a magnesium bolus for dehydration and magnesium of 1.6.  She was discharged home the same day.  Overall, she reports that she is feeling better and working to stay very well-hydrated.  She does have a little bit of tenderness where she hit her head but the small abrasion has scabbed over and is healing well.  She is concerned because she is having difficulty with feeling "loopy".  She is having trouble cognitively and was unable to complete logic puzzles that she normally does in the evening.  She is having vertigo type symptoms with head motion and feeling overall offkilter.  No other neurological symptoms.  As they did not do any imaging in the ED of the head, would like to get CT head without contrast to further evaluate the cognitive deficits.  Likely experiencing some postconcussion symptoms.  Advised cognitive rest, avoiding computers and Warden/ranger, and self-care.  Information provided with AVS regarding  concussions and expectations. - CT HEAD WO CONTRAST ( ); Future   Procedures performed this visit: None.  Return if symptoms worsen or fail to improve.  __________________________________ Thayer Ohm, DNP, APRN, FNP-BC Primary Care and Sports Medicine [facility]

## 2023-04-20 NOTE — Patient Instructions (Signed)
Concussion, Adult  A concussion is a brain injury from a hard, direct hit (trauma) to the head or body. This direct hit causes the brain to shake quickly back and forth inside the skull. This can damage brain cells and cause chemical changes in the brain. A concussion may also be known as a mild traumatic brain injury (TBI). The effects of a concussion can be serious. If you have a concussion, you should be very careful to avoid having a second concussion. What are the causes? This condition is caused by: A direct hit to your head. Sudden movement of your body that causes your brain to move back and forth inside the skull, such as in a car crash. What are the signs or symptoms? The signs of a concussion can be hard to notice. Early on, they may be missed by you, family members, and health care providers. You may look fine on the outside but may act or feel differently. Every head injury is different. Symptoms are usually temporary but may last for days, weeks, or even months. Some symptoms appear right away, but other symptoms may not show up for hours or days. Physical symptoms Headaches. Dizziness and problems with coordination or balance. Sensitivity to light or noise. Nausea or vomiting. Tiredness (fatigue). Vision or hearing problems. Seizure. Mental and emotional symptoms Irritability or mood changes. Memory problems. Trouble concentrating, organizing, or making decisions. Changes in eating or sleeping patterns. Slowness in thinking, acting or reacting, speaking, or reading. Anxiety or depression. How is this diagnosed? This condition is diagnosed based on your symptoms and injury. You may also have tests, including: Imaging tests, such as a CT scan or an MRI. Neuropsychological tests. These measure your thinking, understanding, learning, and memory. How is this treated? Treatment for this condition includes: Stopping sports or activity if you are injured. Physical and mental  rest and careful observation, usually at home. Medicines to help with symptoms such as headaches, nausea, or difficulty sleeping. Referral to a concussion clinic or rehab center. Follow these instructions at home: Activity Limit activities that require a lot of thought or concentration, such as: Doing homework or job-related work. Watching TV. Using the computer or phone. Playing memory games and doing puzzles. Rest helps your brain heal. Make sure you: Get plenty of sleep. Most adults should get 7-9 hours of sleep each night. Rest during the day. Take naps or rest breaks when you feel tired. Avoid high-intensity exercise or physical activities that take a lot of effort. Stop any activity that worsens symptoms. Your health care provider may recommend light exercise such as walking. Do not do high-risk activities that could cause a second concussion, such as riding a bike or playing sports. Ask your health care provider when you can return to your normal activities, such as school, work, sports, and driving. Your ability to react may be slower after a brain injury. Never do these activities if you are dizzy. General instructions  Take over-the-counter and prescription medicines only as told by your health care provider. Some medicines, such as blood thinners (anticoagulants) and aspirin, may increase the risk for complications, such as bleeding. Avoid taking opioid pain medicine while recovering from a concussion. Do not drink alcohol until your health care provider says you can. Drinking alcohol may slow your recovery and can put you at risk of further injury. Watch your symptoms and tell others around you to do the same. Complications sometimes occur after a concussion. Tell your work Production designer, theatre/television/film, teachers, Tax adviser,  school counselor, coach, or sports trainer about your injury, symptoms, and restrictions. See a mental health therapist if you feel anxious or depressed. Managing this condition  can be challenging. Keep all follow-up visits. Your health care provider will check on your recovery and give you a plan for returning to activities. How is this prevented? Avoiding another brain injury is very important. In rare cases, another injury can lead to permanent brain damage, brain swelling, or death. The risk of this is greatest during the first 7-10 days after a head injury. Avoid injuries by: Stopping activities that could lead to a second concussion, such as contact or recreational sports, until your health care provider says it is okay. Taking these actions once you have returned to sports or activities: Avoid plays or moves that can cause you to crash into another person. This is how most concussions occur. Follow the rules and be respectful of other players. Do not engage in violent or illegal plays. Getting regular exercise that includes strength and balance training. Wearing a properly fitting helmet during sports, biking, or other activities. Helmets can help protect you from serious skull and brain injuries, but they may not protect you from a concussion. Even when wearing a helmet, you should avoid being hit in the head. Where to find more information Centers for Disease Control and Prevention: TonerPromos.no Contact a health care provider if: Your symptoms do not improve or get worse. You have new symptoms. You have another injury. Your coordination gets worse. You have unusual behavior changes. Get help right away if: You have a severe or worsening headache. You have weakness or numbness in any part of your body, slurred speech, vision changes, or confusion. You vomit repeatedly. You lose consciousness, are sleepier than normal, or are difficult to wake up. You have a seizure. These symptoms may be an emergency. Get help right away. Call 911. Do not wait to see if the symptoms will go away. Do not drive yourself to the hospital. Also, get help right away if: You have  thoughts of hurting yourself or others. Take one of these steps if you feel like you may hurt yourself or others, or have thoughts about taking your own life: Go to your nearest emergency room. Call 911. Call the National Suicide Prevention Lifeline at 223 846 3749 or 988. This is open 24 hours a day. Text the Crisis Text Line at 959 707 2384. This information is not intended to replace advice given to you by your health care provider. Make sure you discuss any questions you have with your health care provider. Document Revised: 08/13/2021 Document Reviewed: 08/13/2021 Elsevier Patient Education  2024 [employer].

## 2023-04-21 ENCOUNTER — Encounter: Payer: Self-pay | Admitting: Medical-Surgical

## 2023-05-03 ENCOUNTER — Encounter: Payer: Self-pay | Admitting: Medical-Surgical

## 2023-05-03 LAB — CBC WITH DIFFERENTIAL/PLATELET
Basophils Absolute: 0.1 10*3/uL (ref 0.0–0.2)
Basos: 1 %
EOS (ABSOLUTE): 0.3 10*3/uL (ref 0.0–0.4)
Eos: 4 %
Hematocrit: 37.5 % (ref 34.0–46.6)
Hemoglobin: 12.5 g/dL (ref 11.1–15.9)
Immature Grans (Abs): 0 10*3/uL (ref 0.0–0.1)
Immature Granulocytes: 0 %
Lymphocytes Absolute: 2.1 10*3/uL (ref 0.7–3.1)
Lymphs: 29 %
MCH: 30 pg (ref 26.6–33.0)
MCHC: 33.3 g/dL (ref 31.5–35.7)
MCV: 90 fL (ref 79–97)
Monocytes Absolute: 0.4 10*3/uL (ref 0.1–0.9)
Monocytes: 6 %
Neutrophils Absolute: 4.3 10*3/uL (ref 1.4–7.0)
Neutrophils: 60 %
Platelets: 219 10*3/uL (ref 150–450)
RBC: 4.16 x10E6/uL (ref 3.77–5.28)
RDW: 11.9 % (ref 11.7–15.4)
WBC: 7.2 10*3/uL (ref 3.4–10.8)

## 2023-05-03 LAB — CMP14+EGFR
ALT: 13 [IU]/L (ref 0–32)
AST: 15 [IU]/L (ref 0–40)
Albumin: 3.7 g/dL — ABNORMAL LOW (ref 3.9–4.9)
Alkaline Phosphatase: 60 [IU]/L (ref 44–121)
BUN/Creatinine Ratio: 11 (ref 9–23)
BUN: 9 mg/dL (ref 6–24)
Bilirubin Total: <0.2 mg/dL (ref 0.0–1.2)
CO2: 24 mmol/L (ref 20–29)
Calcium: 9 mg/dL (ref 8.7–10.2)
Chloride: 105 mmol/L (ref 96–106)
Creatinine, Ser: 0.8 mg/dL (ref 0.57–1.00)
Globulin, Total: 2 g/dL (ref 1.5–4.5)
Glucose: 79 mg/dL (ref 70–99)
Potassium: 4.1 mmol/L (ref 3.5–5.2)
Sodium: 140 mmol/L (ref 134–144)
Total Protein: 5.7 g/dL — ABNORMAL LOW (ref 6.0–8.5)
eGFR: 91 mL/min/{1.73_m2} (ref >59)

## 2023-05-03 LAB — MAGNESIUM: Magnesium: 1.9 mg/dL (ref 1.6–2.3)

## 2023-05-08 ENCOUNTER — Other Ambulatory Visit (HOSPITAL_COMMUNITY): Payer: Self-pay

## 2023-05-10 ENCOUNTER — Other Ambulatory Visit (HOSPITAL_COMMUNITY): Payer: Self-pay

## 2023-05-10 MED ORDER — ZEPBOUND 15 MG/0.5ML ~~LOC~~ SOAJ
15.0000 mg | SUBCUTANEOUS | 1 refills | Status: DC
  Filled 2023-05-10: qty 2, 28d supply, fill #0
  Filled 2023-07-09: qty 2, 28d supply, fill #1

## 2023-05-24 ENCOUNTER — Other Ambulatory Visit: Payer: Self-pay

## 2023-06-07 ENCOUNTER — Other Ambulatory Visit (HOSPITAL_COMMUNITY): Payer: Self-pay

## 2023-06-21 ENCOUNTER — Other Ambulatory Visit (HOSPITAL_COMMUNITY): Payer: Self-pay

## 2023-06-21 MED ORDER — ZEPBOUND 15 MG/0.5ML ~~LOC~~ SOAJ
15.0000 mg | SUBCUTANEOUS | 2 refills | Status: DC
  Filled 2023-06-21 – 2023-08-26 (×2): qty 2, 28d supply, fill #0

## 2023-07-05 ENCOUNTER — Other Ambulatory Visit: Payer: Self-pay | Admitting: Medical-Surgical

## 2023-07-05 DIAGNOSIS — Z0000 Encounter for general adult medical examination without abnormal findings: Secondary | ICD-10-CM

## 2023-07-07 ENCOUNTER — Ambulatory Visit
Admission: RE | Admit: 2023-07-07 | Discharge: 2023-07-07 | Disposition: A | Discharge status: Home / Self Care | Source: Ambulatory Visit | Attending: Medical-Surgical | Admitting: Medical-Surgical

## 2023-07-07 DIAGNOSIS — Z0000 Encounter for general adult medical examination without abnormal findings: Secondary | ICD-10-CM

## 2023-07-10 ENCOUNTER — Other Ambulatory Visit (HOSPITAL_COMMUNITY): Payer: Self-pay

## 2023-07-12 ENCOUNTER — Encounter: Payer: Self-pay | Admitting: Medical-Surgical

## 2023-08-18 ENCOUNTER — Ambulatory Visit (HOSPITAL_BASED_OUTPATIENT_CLINIC_OR_DEPARTMENT_OTHER): Payer: BC Managed Care – PPO | Admitting: Certified Nurse Midwife

## 2023-08-21 ENCOUNTER — Ambulatory Visit (HOSPITAL_BASED_OUTPATIENT_CLINIC_OR_DEPARTMENT_OTHER): Admitting: Certified Nurse Midwife

## 2023-08-25 ENCOUNTER — Ambulatory Visit (HOSPITAL_BASED_OUTPATIENT_CLINIC_OR_DEPARTMENT_OTHER): Admitting: Certified Nurse Midwife

## 2023-08-25 ENCOUNTER — Other Ambulatory Visit (HOSPITAL_COMMUNITY)
Admission: RE | Admit: 2023-08-25 | Discharge: 2023-08-25 | Disposition: A | Discharge status: Home / Self Care | Source: Ambulatory Visit | Attending: Certified Nurse Midwife | Admitting: Certified Nurse Midwife

## 2023-08-25 ENCOUNTER — Encounter (HOSPITAL_BASED_OUTPATIENT_CLINIC_OR_DEPARTMENT_OTHER): Payer: Self-pay | Admitting: Certified Nurse Midwife

## 2023-08-25 VITALS — BP 106/57 | HR 61 | Ht 68.0 in | Wt 154.2 lb

## 2023-08-25 DIAGNOSIS — Z124 Encounter for screening for malignant neoplasm of cervix: Secondary | ICD-10-CM | POA: Diagnosis present

## 2023-08-25 DIAGNOSIS — G479 Sleep disorder, unspecified: Secondary | ICD-10-CM

## 2023-08-25 DIAGNOSIS — Z01419 Encounter for gynecological examination (general) (routine) without abnormal findings: Secondary | ICD-10-CM

## 2023-08-25 MED ORDER — PROGESTERONE 200 MG PO CAPS: 200.0000 mg | ORAL_CAPSULE | Freq: Every day | ORAL | 3 refills | Status: AC

## 2023-08-25 MED ORDER — VALACYCLOVIR HCL 500 MG PO TABS: 500.0000 mg | ORAL_TABLET | ORAL | 0 refills | Status: AC | PRN

## 2023-08-25 NOTE — Progress Notes (Addendum)
 48 y.o. G3P0010 Single White or Caucasian female here for annual exam.  Takes Valtrex is oral fever blister develops.  No LMP recorded.          Sexually active: No.  Exercising: Yes.     Smoker:  no  Health Maintenance: Pap:  06/03/2022 Positive HPV, AS-CUS History of abnormal Pap:  yes MMG:  07/07/2023 Negative Colonoscopy:  09/17/2021    reports that she has never smoked. She has never used smokeless tobacco. She reports current alcohol use of about 2.0 standard drinks of alcohol per week. She reports that she does not use drugs.  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   Hepatic cyst 04/14/2020   Medical history non-contributory    NSVT (nonsustained ventricular tachycardia) (HCC) 06/18/2020   Palpitations 04/14/2020    Past Surgical History:  Procedure Laterality Date   APPENDECTOMY     GANGLION CYST EXCISION Left 2003   LAPAROSCOPIC APPENDECTOMY N/A 05/03/2018   Procedure: APPENDECTOMY LAPAROSCOPIC;  Surgeon: Oralee Billow, MD;  Location: [facility];  Service: General;  Laterality: N/A;   WISDOM TOOTH EXTRACTION  2005   WRIST FRACTURE SURGERY Right 2010    Current Outpatient Medications  Medication Sig Dispense Refill   aspirin 325 MG tablet Take 325 mg by mouth daily.     buPROPion  (WELLBUTRIN  XL) 300 MG 24 hr tablet Take 150 mg by mouth daily.     Cholecalciferol (VITAMIN D3) 1.25 MG (50000 UT) CAPS Take 1 capsule by mouth once a week.     gabapentin (NEURONTIN) 300 MG capsule      metoprolol  tartrate (LOPRESSOR ) 25 MG tablet EVERY 12 HOURS AS NEEDED FOR PALPITATIONS 90 tablet 3   mirtazapine  (REMERON ) 15 MG tablet Take 15 mg by mouth at bedtime.     progesterone  (PROMETRIUM ) 200 MG capsule Take 1 capsule (200 mg total) by mouth daily. 90 capsule 3    spironolactone (ALDACTONE) 50 MG tablet Take 50 mg by mouth 2 (two) times daily.     tirzepatide  (ZEPBOUND ) 15 MG/0.5ML Pen Inject 15 mg into the skin once a week. 2 mL 1   tirzepatide  (ZEPBOUND ) 15 MG/0.5ML Pen Inject 15 mg into the skin once a week. 2 mL 0   tirzepatide  (ZEPBOUND ) 15 MG/0.5ML Pen Inject 15 mg into the skin once a week. 2 mL 0   tirzepatide  (ZEPBOUND ) 15 MG/0.5ML Pen Inject 15 mg into the skin once a week. 2 mL 1   tirzepatide  (ZEPBOUND ) 15 MG/0.5ML Pen Inject 15 mg into the skin once a week. 2 mL 2   tretinoin  (RETIN-A ) 0.025 % gel Apply topically at bedtime. 45 g 0   valACYclovir (VALTREX) 500 MG tablet Take 500 mg by mouth as needed.     No current facility-administered medications for this visit.    Family History  Problem Relation Age of Onset   Heart attack Maternal Grandmother    Heart attack Maternal Grandfather    Hypertension Father    Colon polyps Mother    Hypertension Mother    Stroke Mother    Lung cancer Mother    Clotting disorder Brother    Colon cancer Neg Hx    Esophageal cancer Neg Hx  Rectal cancer Neg Hx    Stomach cancer Neg Hx     ROS: Constitutional: negative Genitourinary:negative  Exam:   There were no vitals taken for this visit.     General appearance: alert, cooperative and appears stated age Head: Normocephalic, without obvious abnormality, atraumatic Breasts: had mammogram recently Heart: regular rate  Abdomen: soft, non-tender; bowel sounds normal; no masses,  no organomegaly Extremities: extremities normal, atraumatic, no cyanosis or edema Skin: Skin color, texture, turgor normal. No rashes or lesions Lymph nodes: Cervical, supraclavicular, and axillary nodes normal. No abnormal inguinal nodes palpated Neurologic: Grossly normal   Pelvic: External genitalia:  no lesions              Urethra:  normal appearing urethra with no masses, tenderness or lesions              Bartholins and Skenes: normal                  Vagina: normal appearing vagina with normal color and no discharge, no lesions              Cervix: no lesions              Pap taken: Yes.   Bimanual Exam:  Uterus:  normal size, contour, position, consistency, mobility, non-tender              Adnexa: no mass, fullness, tenderness              Anus:  normal sphincter tone, no lesions  Chaperone, CMA, was present for exam.  Assessment/Plan:  1. Encounter for annual routine gynecological examination - Continue annual screening mammograms  2. Cervical cancer screening (Primary) - Cytology - PAP( [place])  3. Trouble in sleeping (Vasomotor night sweats) - progesterone  (PROMETRIUM ) 200 MG capsule; Take 1 capsule (200 mg total) by mouth daily.  Dispense: 90 capsule; Refill: 3   RTO 1 year for annual gyn exam and prn if issues arise.Yolanda Hence

## 2023-08-26 ENCOUNTER — Other Ambulatory Visit (HOSPITAL_COMMUNITY): Payer: Self-pay

## 2023-08-27 ENCOUNTER — Other Ambulatory Visit: Payer: Self-pay

## 2023-08-30 ENCOUNTER — Other Ambulatory Visit (HOSPITAL_COMMUNITY): Payer: Self-pay

## 2023-09-04 LAB — CYTOLOGY - PAP
Comment: NEGATIVE
Comment: NEGATIVE
Comment: NEGATIVE
Diagnosis: UNDETERMINED — AB
HPV 16: NEGATIVE
HPV 18 / 45: NEGATIVE
High risk HPV: POSITIVE — AB

## 2023-09-05 ENCOUNTER — Ambulatory Visit (HOSPITAL_BASED_OUTPATIENT_CLINIC_OR_DEPARTMENT_OTHER): Payer: Self-pay | Admitting: Certified Nurse Midwife

## 2023-09-07 ENCOUNTER — Encounter: Payer: Self-pay | Admitting: Medical-Surgical

## 2023-09-07 ENCOUNTER — Ambulatory Visit: Admitting: Medical-Surgical

## 2023-09-07 VITALS — BP 105/69 | HR 86 | Resp 20 | Ht 68.0 in | Wt 153.1 lb

## 2023-09-07 DIAGNOSIS — R198 Other specified symptoms and signs involving the digestive system and abdomen: Secondary | ICD-10-CM

## 2023-09-07 MED ORDER — DICYCLOMINE HCL 10 MG PO CAPS: 10.0000 mg | ORAL_CAPSULE | Freq: Three times a day (TID) | ORAL | 0 refills | Status: DC

## 2023-09-07 NOTE — Progress Notes (Signed)
        Established patient visit  History, exam, impression, and plan:  1. GI symptoms (Primary) Very pleasant 48 year old female presenting today with complaints of 2 weeks of GI symptoms including lower abdominal cramping, sulfur burps, nausea with 1 episode of vomiting.  Has had no other symptoms and denies fever, chills, reflux, hematemesis, hematochezia, and melena.  Has a history of having the symptoms however Gas-X has helped in the past.  Notes that she feels like she has a rock tumbler in her lower abdomen and it is just very uncomfortable.  She has tried Pepto Ismol and Gas-X but no relief of symptoms.  On Friday evening, she had 2 glasses of wine and the next morning she felt absolutely horrible.  Since she had the vomiting episode, she has had no more loose stools and her sulfur burps have resolved.  She is working to increase her daily fluids to prevent dehydration and getting about 120 ounces of fluid per day.  Eating and drinking normally otherwise.  On evaluation, her abdomen is soft, nondistended, nontender.  Bowel sounds positive x 4.  No HSM.  Cardiopulmonary exam normal.  Unclear etiology.  Plan for labs as below for further evaluation.  Get a GI profile to rule out infectious causes.  If all of that is normal and her symptoms persist, we can certainly consider imaging. - CBC with Differential/Platelet - CMP14+EGFR - TSH - Amylase - Lipase - GI Profile, Stool, PCR  Procedures performed this visit: None.  Return if symptoms worsen or fail to improve.  __________________________________ Maryl Snook, DNP, APRN, FNP-BC Primary Care and Sports Medicine [facility]

## 2023-09-08 ENCOUNTER — Ambulatory Visit (HOSPITAL_BASED_OUTPATIENT_CLINIC_OR_DEPARTMENT_OTHER): Admitting: Obstetrics & Gynecology

## 2023-09-08 ENCOUNTER — Other Ambulatory Visit (HOSPITAL_COMMUNITY)
Admission: RE | Admit: 2023-09-08 | Discharge: 2023-09-08 | Disposition: A | Discharge status: Home / Self Care | Source: Ambulatory Visit | Attending: Obstetrics & Gynecology | Admitting: Obstetrics & Gynecology

## 2023-09-08 VITALS — BP 113/73 | HR 84 | Ht 68.0 in | Wt 154.0 lb

## 2023-09-08 DIAGNOSIS — R87610 Atypical squamous cells of undetermined significance on cytologic smear of cervix (ASC-US): Secondary | ICD-10-CM | POA: Insufficient documentation

## 2023-09-08 DIAGNOSIS — R87810 Cervical high risk human papillomavirus (HPV) DNA test positive: Secondary | ICD-10-CM | POA: Diagnosis not present

## 2023-09-08 NOTE — Progress Notes (Signed)
 48 y.o. G1P0010 Single Not Hispanic or Latino female here for colposcopy with possible biopsies and/or ECC due to ASCUS Pap with HR HPV.  Subtype 16/18/45 were negative.   Pap was obtained 08/25/2023.   Patient's last menstrual period was 08/21/2023.          Sexually active: Yes.     Patient has been counseled about results and procedure.  Risks and benefits have bene reviewed including immediate and/or delayed bleeding, infection, cervical scaring from procedure, possibility of needing additional follow up as well as treatment.  Rare risks of missing a lesion discussed as well.  All questions answered.  Pt ready to proceed.  Consent obtained.  BP 113/73 (BP Location: Right Arm, Patient Position: Sitting)   Pulse 84   Ht 5\' 8"  (1.727 m)   Wt 154 lb (69.9 kg)   LMP 08/21/2023   BMI 23.42 kg/m   General appearance: alert, cooperative and appears stated age Lymph nodes: No abnormal inguinal nodes palpated Neurologic: Grossly normal  Pelvic: External genitalia:  no lesions              Urethra:  normal appearing urethra with no masses, tenderness or lesions              Bartholins and Skenes: normal                 Vagina: normal appearing vagina with normal color and no discharge, no lesions               Physical Exam Constitutional:      Appearance: Normal appearance.  Genitourinary:    Neurological:     General: No focal deficit present.     Mental Status: She is alert.  Psychiatric:        Mood and Affect: Mood normal.     Speculum placed.  3% acetic acid applied to cervix for >45 seconds.  Cervix visualized with both 7.5X and 15X magnification.  Green filter also used.  Lugols solution was not used.  Findings:  HPV changes at 6 and 12 o'clock.  Biopsy:  6 o'clock.  ECC:  was performed.  Monsel's was needed.  Excellent hemostasis was present.  Pt tolerated procedure well and all instruments were removed.  Findings noted above on picture of cervix.  Chaperone, Myrtie Atkinson,  CMA, was present during procedure.  Assessment/Plan: 1. ASCUS with positive high risk HPV cervical (Primary) - Pathology results will be called to patient and follow-up planned pending results.

## 2023-09-09 ENCOUNTER — Ambulatory Visit: Payer: Self-pay | Admitting: Medical-Surgical

## 2023-09-09 LAB — CMP14+EGFR
ALT: 19 IU/L (ref 0–32)
AST: 19 IU/L (ref 0–40)
Albumin: 4.4 g/dL (ref 3.9–4.9)
Alkaline Phosphatase: 56 IU/L (ref 44–121)
BUN/Creatinine Ratio: 9 (ref 9–23)
BUN: 7 mg/dL (ref 6–24)
Bilirubin Total: <0.2 mg/dL (ref 0.0–1.2)
CO2: 21 mmol/L (ref 20–29)
Calcium: 9.3 mg/dL (ref 8.7–10.2)
Chloride: 102 mmol/L (ref 96–106)
Creatinine, Ser: 0.76 mg/dL (ref 0.57–1.00)
Globulin, Total: 1.9 g/dL (ref 1.5–4.5)
Glucose: 74 mg/dL (ref 70–99)
Potassium: 4.4 mmol/L (ref 3.5–5.2)
Sodium: 137 mmol/L (ref 134–144)
Total Protein: 6.3 g/dL (ref 6.0–8.5)
eGFR: 97 mL/min/{1.73_m2} (ref >59)

## 2023-09-09 LAB — CBC WITH DIFFERENTIAL/PLATELET
Basophils Absolute: 0.1 10*3/uL (ref 0.0–0.2)
Basos: 1 %
EOS (ABSOLUTE): 0.5 10*3/uL — ABNORMAL HIGH (ref 0.0–0.4)
Eos: 7 %
Hematocrit: 38.7 % (ref 34.0–46.6)
Hemoglobin: 12.7 g/dL (ref 11.1–15.9)
Immature Grans (Abs): 0 10*3/uL (ref 0.0–0.1)
Immature Granulocytes: 0 %
Lymphocytes Absolute: 2.1 10*3/uL (ref 0.7–3.1)
Lymphs: 28 %
MCH: 30.6 pg (ref 26.6–33.0)
MCHC: 32.8 g/dL (ref 31.5–35.7)
MCV: 93 fL (ref 79–97)
Monocytes Absolute: 0.4 10*3/uL (ref 0.1–0.9)
Monocytes: 6 %
Neutrophils Absolute: 4.3 10*3/uL (ref 1.4–7.0)
Neutrophils: 58 %
Platelets: 286 10*3/uL (ref 150–450)
RBC: 4.15 x10E6/uL (ref 3.77–5.28)
RDW: 12.1 % (ref 11.7–15.4)
WBC: 7.4 10*3/uL (ref 3.4–10.8)

## 2023-09-09 LAB — LIPASE: Lipase: 33 U/L (ref 14–72)

## 2023-09-09 LAB — TSH: TSH: 0.975 u[IU]/mL (ref 0.450–4.500)

## 2023-09-09 LAB — AMYLASE: Amylase: 80 U/L (ref 31–110)

## 2023-09-11 ENCOUNTER — Ambulatory Visit (HOSPITAL_BASED_OUTPATIENT_CLINIC_OR_DEPARTMENT_OTHER): Payer: Self-pay | Admitting: Obstetrics & Gynecology

## 2023-09-11 LAB — SURGICAL PATHOLOGY

## 2023-09-12 ENCOUNTER — Other Ambulatory Visit (HOSPITAL_COMMUNITY): Payer: Self-pay

## 2023-09-12 LAB — GI PROFILE, STOOL, PCR

## 2023-09-12 LAB — SPECIMEN STATUS REPORT

## 2023-09-12 MED ORDER — ZEPBOUND 15 MG/0.5ML ~~LOC~~ SOAJ
15.0000 mg | SUBCUTANEOUS | 2 refills | Status: DC
  Filled 2023-09-12: qty 2, 28d supply, fill #0

## 2023-09-13 ENCOUNTER — Other Ambulatory Visit (HOSPITAL_COMMUNITY): Payer: Self-pay

## 2023-09-15 ENCOUNTER — Ambulatory Visit: Admitting: Physician Assistant

## 2023-09-25 ENCOUNTER — Other Ambulatory Visit (HOSPITAL_COMMUNITY): Payer: Self-pay

## 2023-09-25 MED ORDER — ZEPBOUND 12.5 MG/0.5ML ~~LOC~~ SOAJ
12.5000 mg | SUBCUTANEOUS | 0 refills | Status: AC
  Filled 2023-09-25 – 2023-11-15 (×3): qty 2, 28d supply, fill #0

## 2023-09-26 ENCOUNTER — Other Ambulatory Visit (HOSPITAL_COMMUNITY): Payer: Self-pay

## 2023-09-27 ENCOUNTER — Other Ambulatory Visit (HOSPITAL_COMMUNITY): Payer: Self-pay

## 2023-10-10 ENCOUNTER — Other Ambulatory Visit (HOSPITAL_COMMUNITY): Payer: Self-pay

## 2023-10-19 ENCOUNTER — Other Ambulatory Visit (HOSPITAL_COMMUNITY): Payer: Self-pay

## 2023-10-20 ENCOUNTER — Other Ambulatory Visit (HOSPITAL_COMMUNITY): Payer: Self-pay

## 2023-10-25 ENCOUNTER — Other Ambulatory Visit (HOSPITAL_COMMUNITY): Payer: Self-pay

## 2023-10-26 ENCOUNTER — Other Ambulatory Visit (HOSPITAL_COMMUNITY): Payer: Self-pay

## 2023-11-02 ENCOUNTER — Ambulatory Visit (HOSPITAL_BASED_OUTPATIENT_CLINIC_OR_DEPARTMENT_OTHER): Admitting: Obstetrics & Gynecology

## 2023-11-02 ENCOUNTER — Encounter (HOSPITAL_BASED_OUTPATIENT_CLINIC_OR_DEPARTMENT_OTHER): Payer: Self-pay

## 2023-11-03 ENCOUNTER — Ambulatory Visit (HOSPITAL_BASED_OUTPATIENT_CLINIC_OR_DEPARTMENT_OTHER): Admitting: Obstetrics & Gynecology

## 2023-11-15 ENCOUNTER — Other Ambulatory Visit: Payer: Self-pay

## 2023-11-15 ENCOUNTER — Other Ambulatory Visit (HOSPITAL_COMMUNITY): Payer: Self-pay

## 2023-11-15 ENCOUNTER — Other Ambulatory Visit (HOSPITAL_BASED_OUTPATIENT_CLINIC_OR_DEPARTMENT_OTHER): Payer: Self-pay

## 2023-11-16 ENCOUNTER — Other Ambulatory Visit (HOSPITAL_COMMUNITY): Payer: Self-pay

## 2023-11-17 ENCOUNTER — Other Ambulatory Visit (HOSPITAL_COMMUNITY): Payer: Self-pay

## 2023-11-22 ENCOUNTER — Other Ambulatory Visit (HOSPITAL_COMMUNITY): Payer: Self-pay

## 2023-11-24 ENCOUNTER — Other Ambulatory Visit (HOSPITAL_COMMUNITY): Payer: Self-pay

## 2023-11-29 ENCOUNTER — Other Ambulatory Visit: Payer: Self-pay

## 2024-02-07 ENCOUNTER — Other Ambulatory Visit: Payer: Self-pay | Admitting: Medical Genetics

## 2024-02-07 DIAGNOSIS — Z006 Encounter for examination for normal comparison and control in clinical research program: Secondary | ICD-10-CM

## 2024-03-15 ENCOUNTER — Ambulatory Visit: Admitting: Medical-Surgical

## 2024-03-15 ENCOUNTER — Encounter: Payer: Self-pay | Admitting: Medical-Surgical

## 2024-03-15 VITALS — BP 110/69 | HR 91 | Resp 20 | Ht 68.0 in | Wt 153.0 lb

## 2024-03-15 DIAGNOSIS — K644 Residual hemorrhoidal skin tags: Secondary | ICD-10-CM | POA: Diagnosis not present

## 2024-03-15 NOTE — Progress Notes (Addendum)
° °  Acute Office Visit  Subjective:     Patient ID: Nancy Patel, female    DOB: 13-Oct-1975, 48 y.o.   MRN: [id number]  Chief Complaint  Patient presents with   Hemorrhoids    Concerned about a large external hemorrhoid and to discuss removal options    HPI Patient is in today for hemorrhoids for about a week. Reports having a history of hemorrhoids and constipation with using the Zepbound . She uses Miralax about 2-3 times per week for constipation. Has been using some preparation H with minimal relief. Denies pain, or blood in stool.  Review of Systems  Constitutional: Negative.   HENT: Negative.    Eyes: Negative.   Respiratory: Negative.    Cardiovascular: Negative.   Gastrointestinal:        Hemorrhoids  Genitourinary: Negative.   Musculoskeletal: Negative.   Skin: Negative.   Neurological: Negative.   Endo/Heme/Allergies: Negative.   Psychiatric/Behavioral: Negative.        Objective:    BP 110/69 (BP Location: Left Arm, Cuff Size: Normal)   Pulse 91   Resp 20   Ht 5' 8 (1.727 m)   Wt 69.4 kg   SpO2 99%   BMI 23.27 kg/m  BP Readings from Last 3 Encounters:  03/15/24 110/69  09/08/23 113/73  09/07/23 105/69   Wt Readings from Last 3 Encounters:  03/15/24 69.4 kg  09/08/23 69.9 kg  09/07/23 69.5 kg      Physical Exam Vitals and nursing note reviewed.  Constitutional:      General: She is not in acute distress.    Appearance: Normal appearance.  Cardiovascular:     Rate and Rhythm: Normal rate and regular rhythm.     Pulses: Normal pulses.     Heart sounds: Normal heart sounds.  Pulmonary:     Effort: Pulmonary effort is normal.     Breath sounds: Normal breath sounds.  Neurological:     General: No focal deficit present.     Mental Status: She is alert and oriented to person, place, and time.  Psychiatric:        Mood and Affect: Mood normal.        Behavior: Behavior normal.        Thought Content: Thought content normal.        Judgment:  Judgment normal.     No results found for any visits on 03/15/24.      Assessment & Plan:   1. External hemorrhoids without complication (Primary) - Discussed options for pain management if she develops pain - Will consider in interim if symptoms change while waiting on referral - Referral placed to GI to discuss options for removal - Ambulatory referral to Gastroenterology    No orders of the defined types were placed in this encounter.   Return if symptoms worsen or fail to improve.  Derrek JINNY Freund, NP Student

## 2024-03-15 NOTE — Progress Notes (Signed)
 Medical screening examination/treatment was performed by qualified clinical staff member and as supervising provider I was immediately available for consultation/collaboration. I have reviewed documentation and agree with assessment and plan.  Thayer Ohm, DNP, APRN, FNP-BC [facility] and [facility] Medicine

## 2024-04-12 LAB — GENECONNECT MOLECULAR SCREEN: Genetic Analysis Overall Interpretation: NEGATIVE

## 2024-06-07 ENCOUNTER — Encounter: Payer: Self-pay | Admitting: Medical-Surgical

## 2024-09-06 ENCOUNTER — Ambulatory Visit (HOSPITAL_BASED_OUTPATIENT_CLINIC_OR_DEPARTMENT_OTHER): Admitting: Obstetrics & Gynecology
